# Patient Record
Sex: Male | Born: 1945 | Race: White | Hispanic: No | Marital: Married
Health system: Midwestern US, Academic
[De-identification: ages and names within clinical notes are randomized; demographics above are authoritative.]

---

## 2016-06-24 IMAGING — CR CHEST
2 series · 2 of 2 positions shown · non-contrast
Comparison: none

[chest pa x-wise]
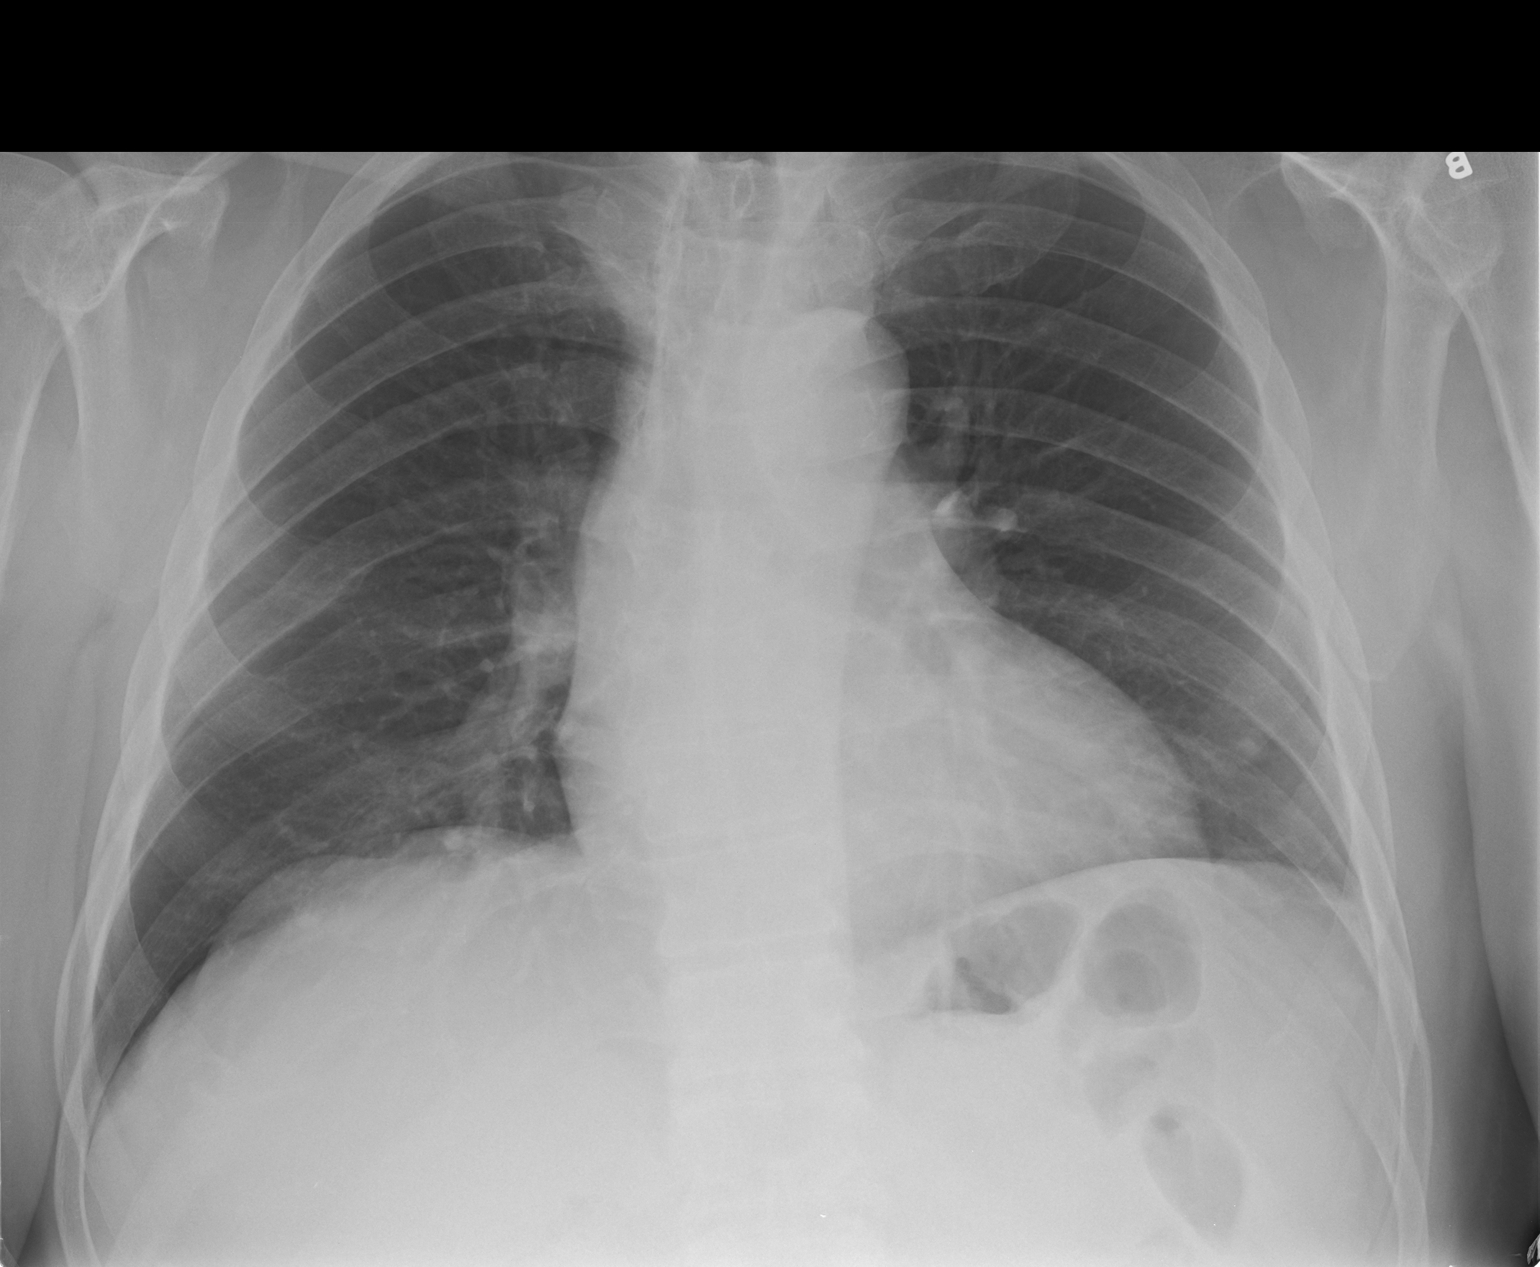

[chest lat]
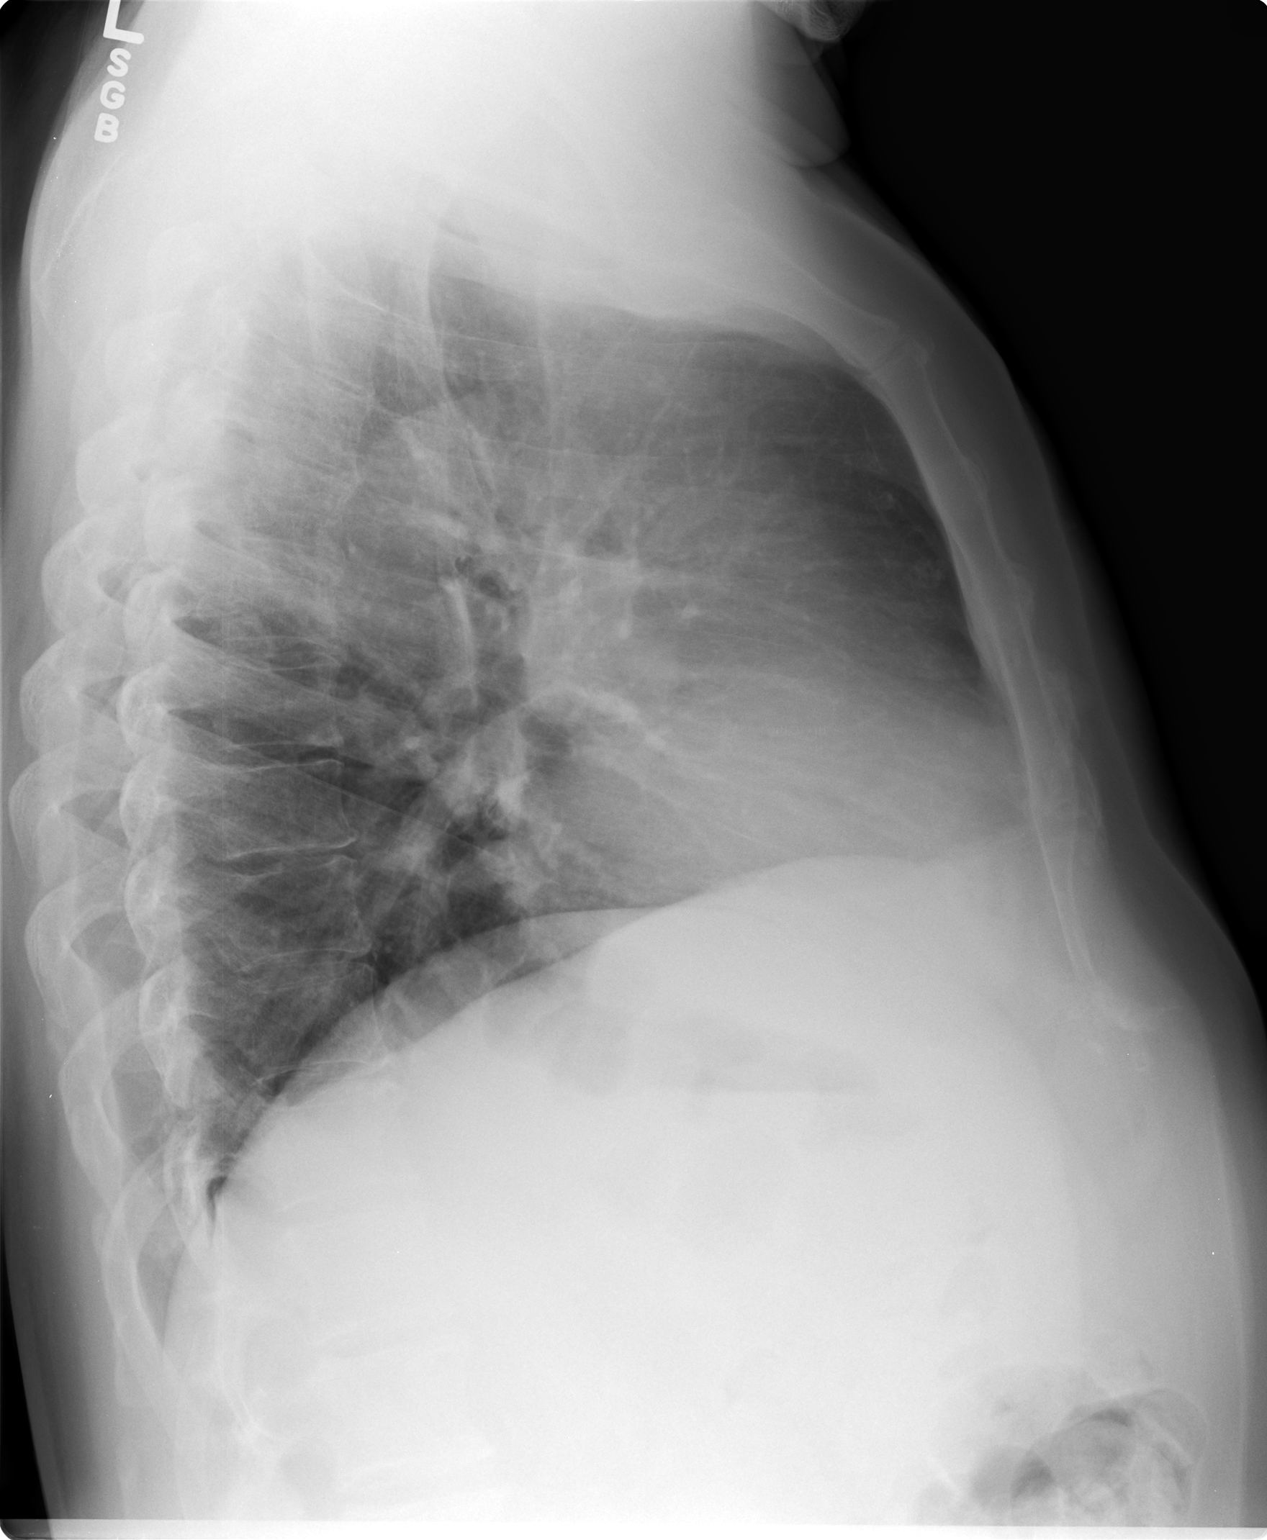

[2 of 2 positions shown; findings below may reference images not displayed]

DIAGNOSTIC STUDIES

EXAM

Chest x-ray two views.

INDICATION

BIBASILAR CRACKLES/BRONCHITIS
COUGH, SOA X 2 WEEKS. FEVER AT FIRST, BUT RESOLVED. PT STATES HTN,
CONTROLLED BY MEDS. AB

TECHNIQUE

PA and lateral views of the chest were obtained.

COMPARISONS

None available

FINDINGS

The cardiomediastinal silhouette is within normal limits. No acute infiltrates are seen. Small
granuloma is noted within the left lung base. Bony thorax is within normal limits.

IMPRESSION

No evidence for active disease in the chest.

## 2017-02-20 IMAGING — CR ABDOMEN
3 series · 3 of 3 positions shown · non-contrast
Comparison: none

[abd uprt x-wise]
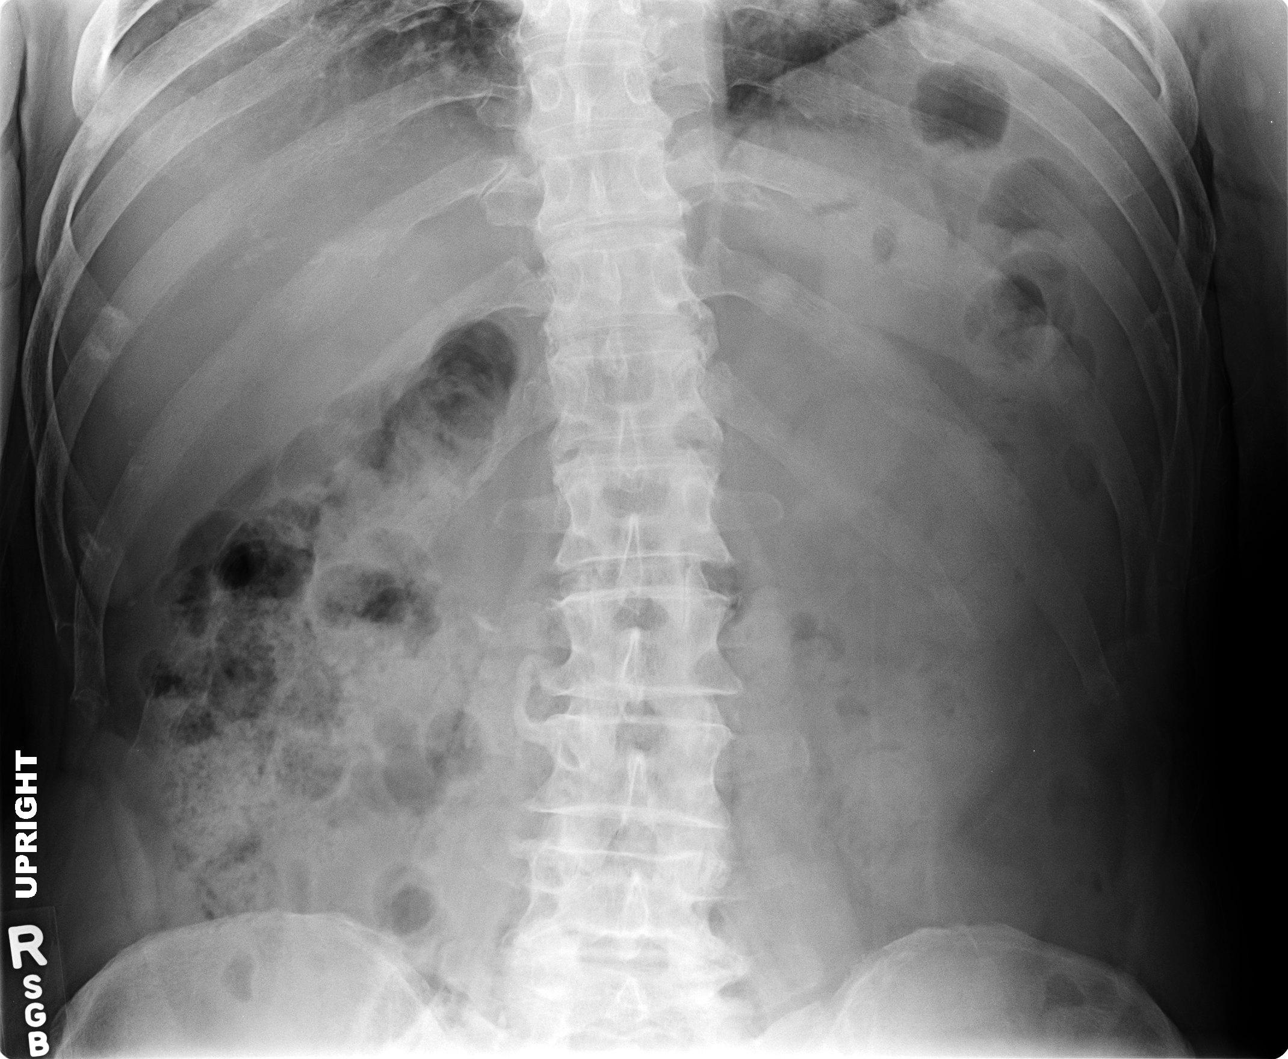

[abd supine x-wise (1 of 2)]
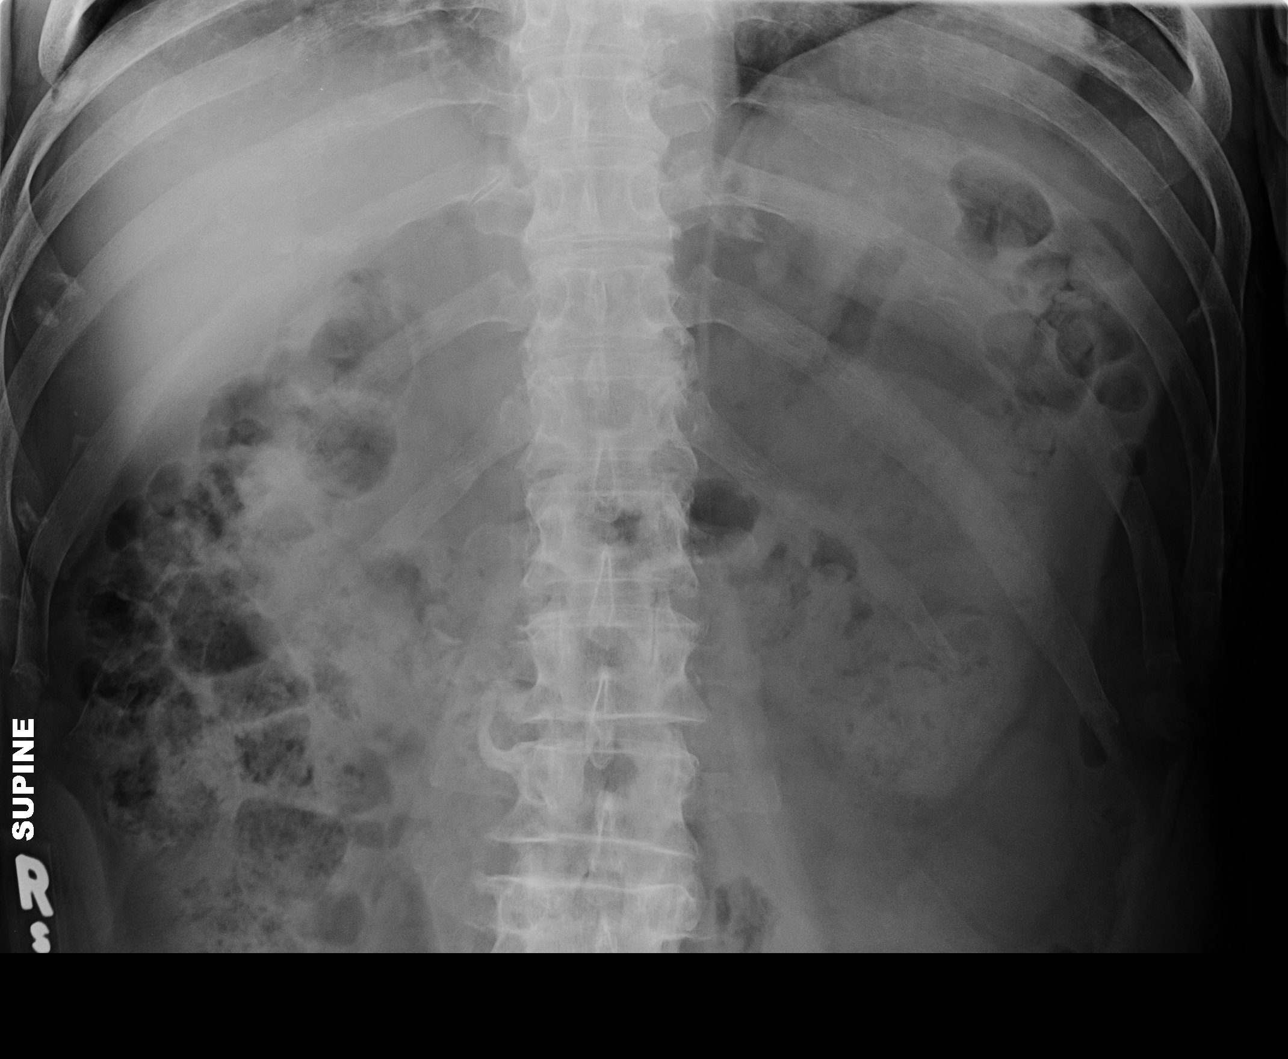

[abd supine x-wise (2 of 2)]
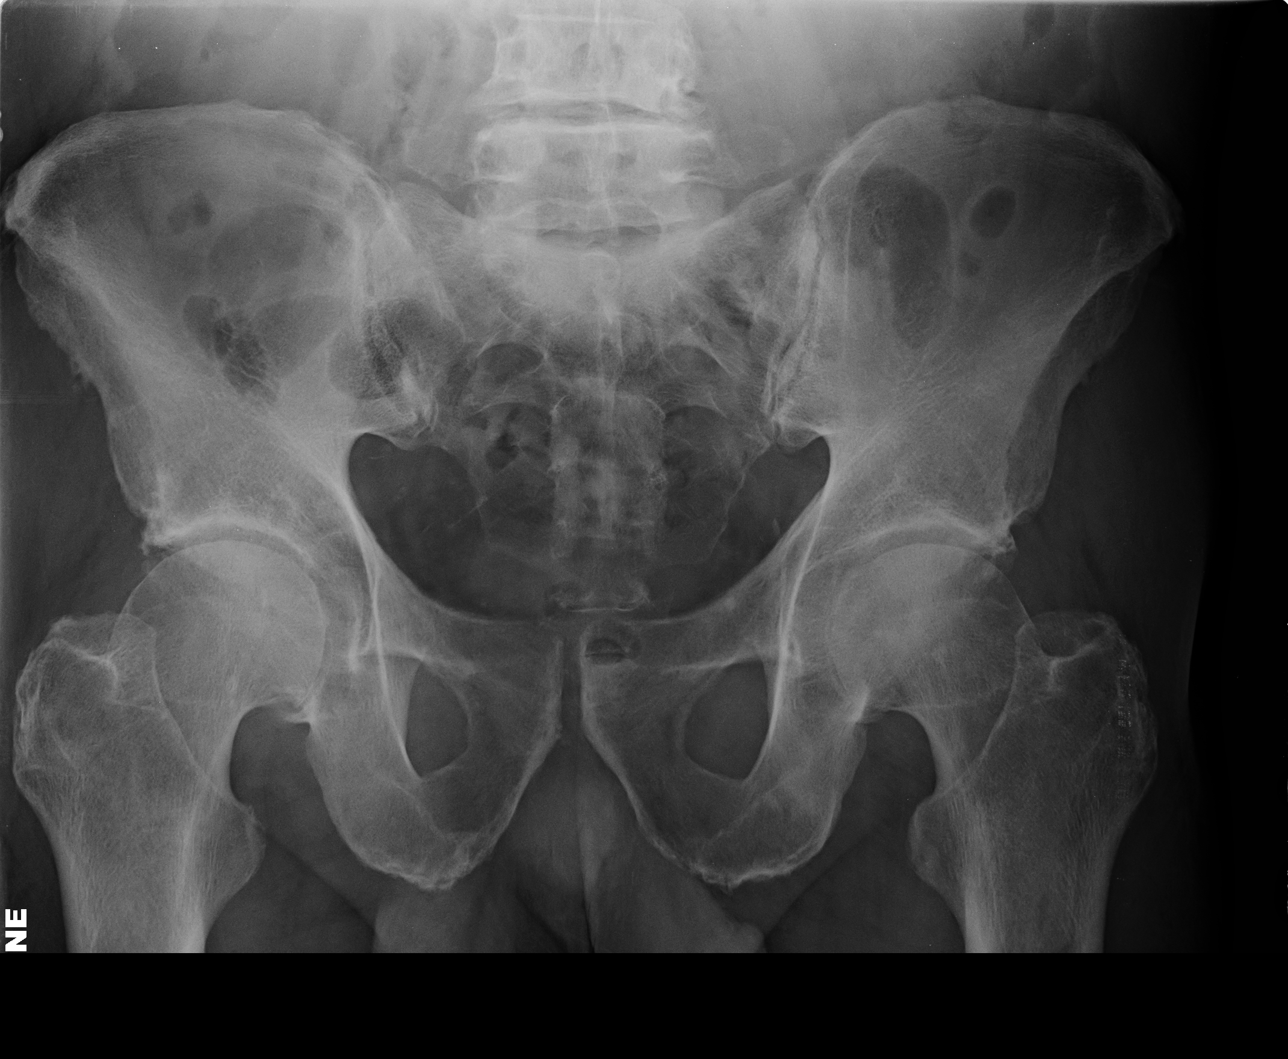

[3 of 3 positions shown; findings below may reference images not displayed]

DIAGNOSTIC STUDIES

EXAM
2 views abdomen

INDICATION
lower abdominal pain bilaterally.
PT STATE BILATERAL ABDOMINAL PAIN X 1WK. PAIN ONLY IN LOWER PART OF
ABDOMEN/GROIN AREA. NO RECENT SURGERIES. NO HX OF KIDNEY OR LIVER
COMPLICATIONS. SB

TECHNIQUE
Upright and supine views abdomen

COMPARISONS
No prior studies are available for comparison.

FINDINGS
There is a nonspecific, nonobstructive bowel gas pattern. There is a moderate] amount of
intracolonic fecal distention. There is no evidence of free air. There is no pathologic
calcification or acute osseous abnormality.

IMPRESSION
Nonspecific, nonobstructive bowel gas pattern. Moderate stool.

## 2017-06-13 IMAGING — US USSCROT
1 series · 14 of 25 positions shown · non-contrast
Comparison: none

[Series 1: us scrotum content · 14 of 53 slices shown]
[im 1/53]
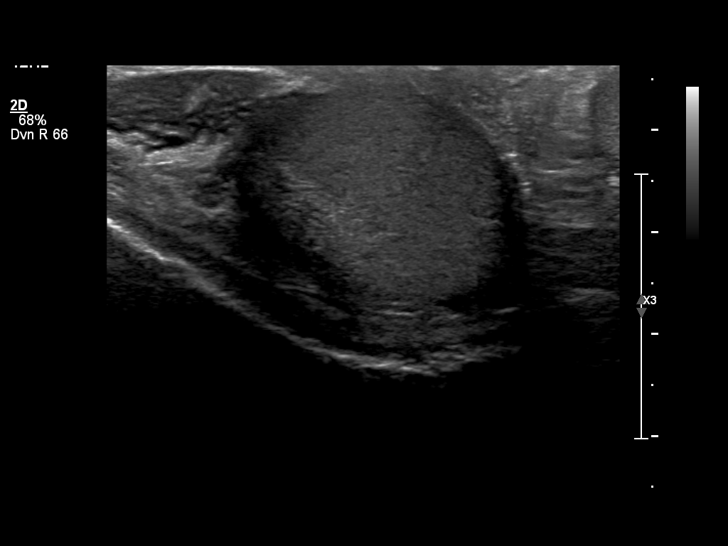
[im 5/53]
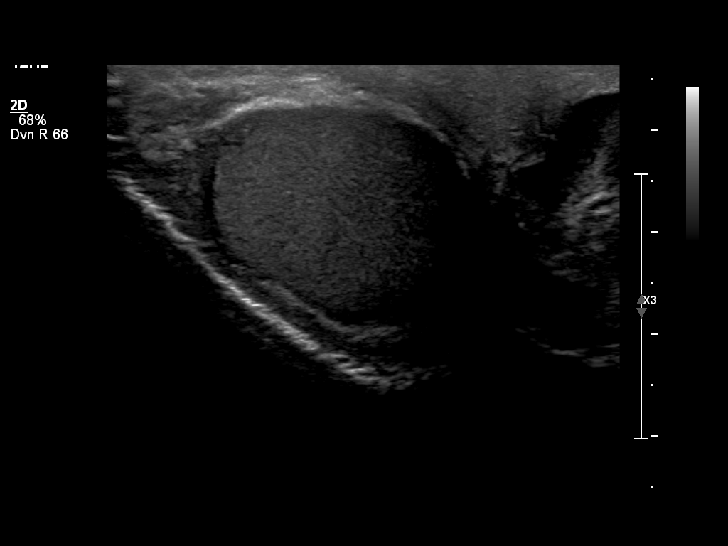
[im 9/53]
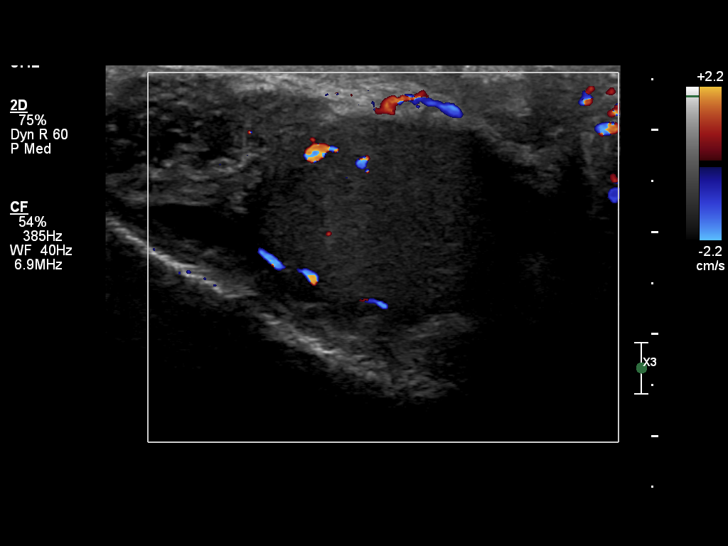
[im 14/53]
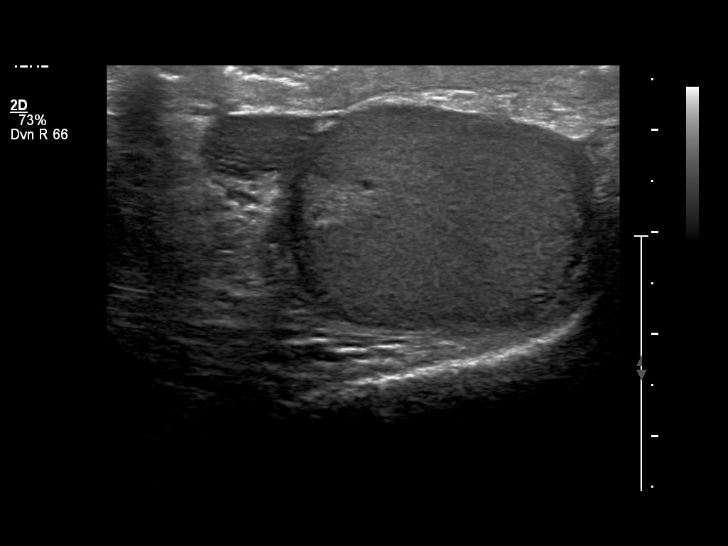
[im 18/53]
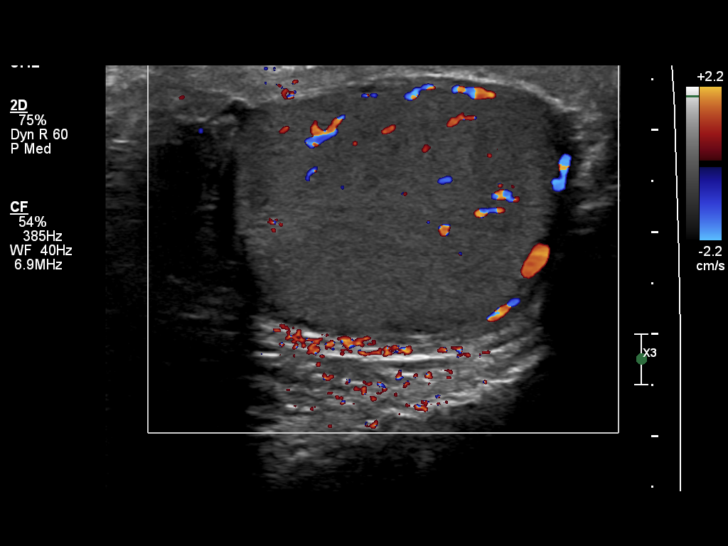
[im 20/53]
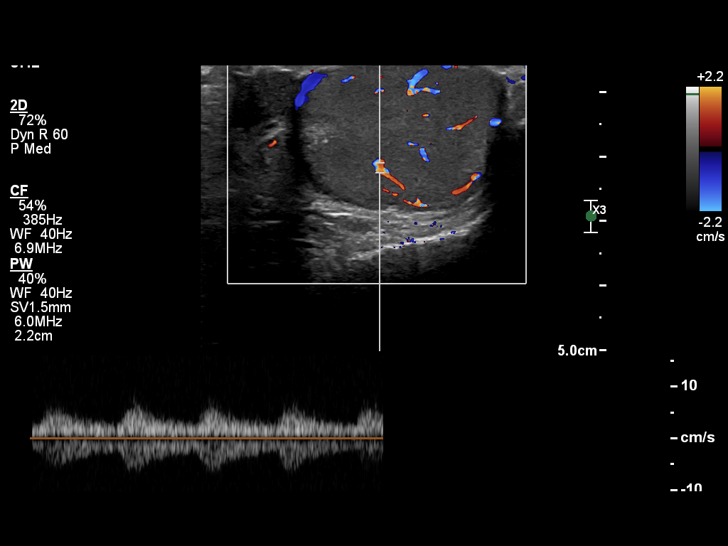
[im 24/53]
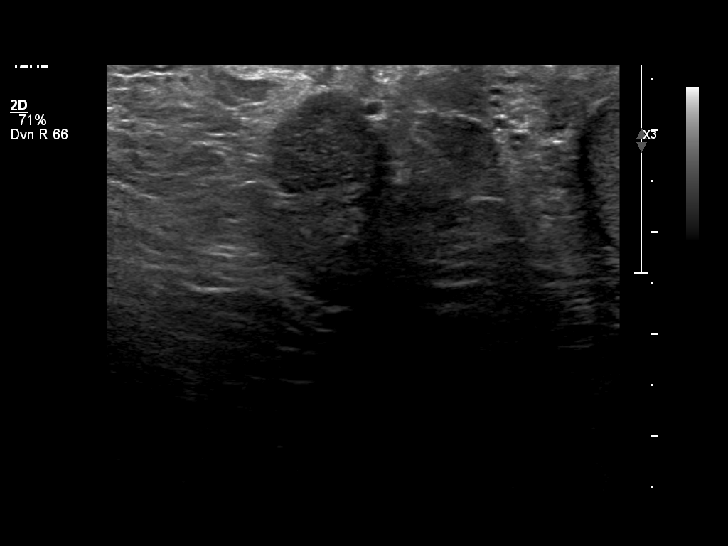
[im 29/53]
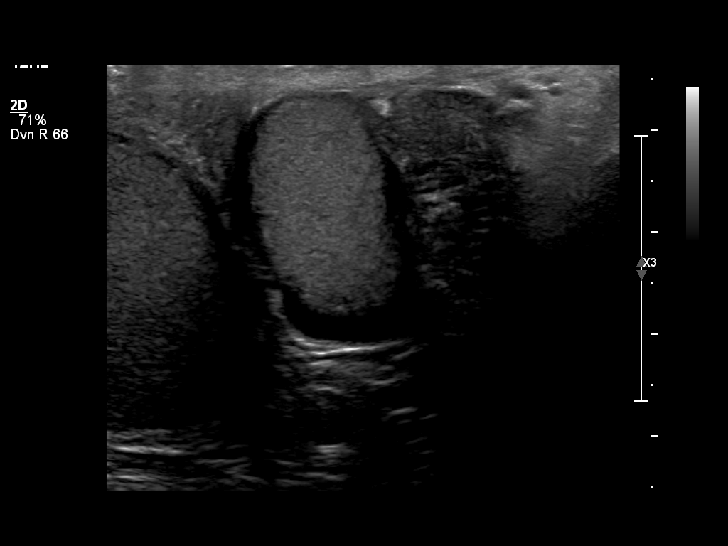
[im 33/53]
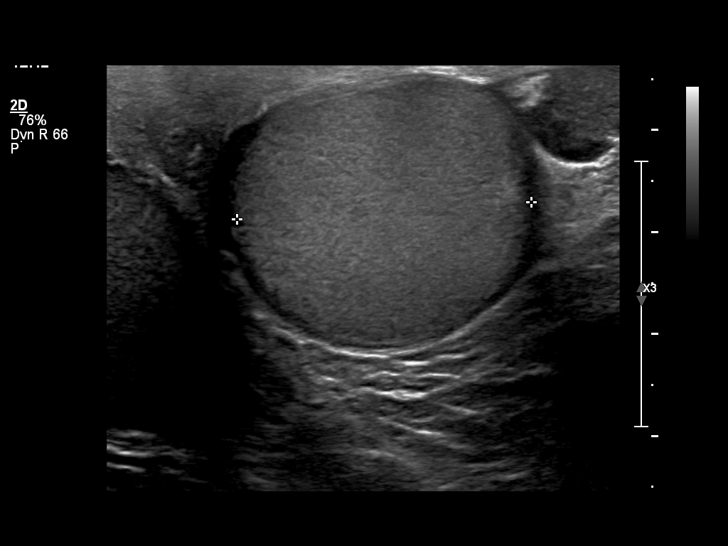
[im 35/53]
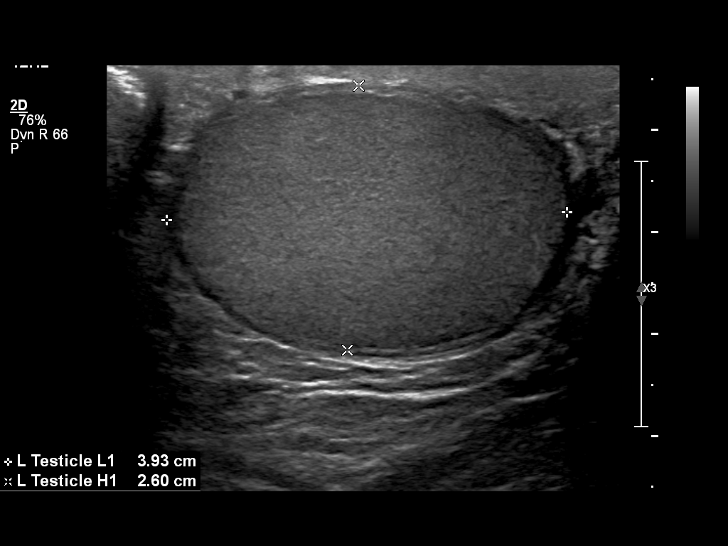
[im 40/53]
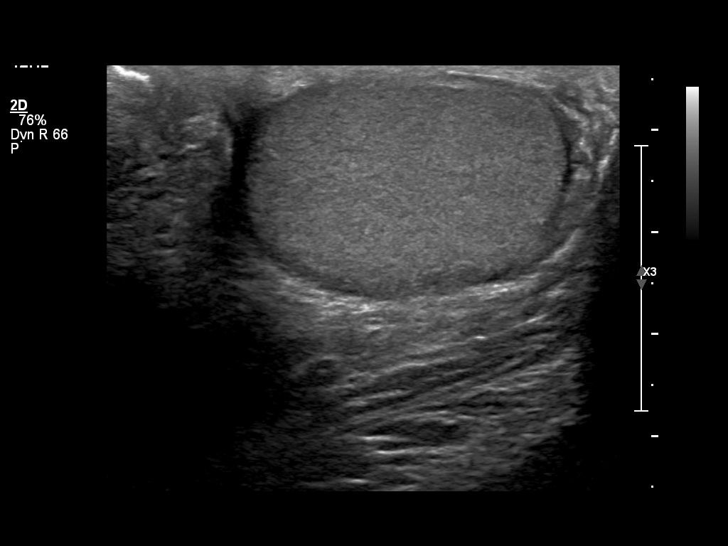
[im 44/53]
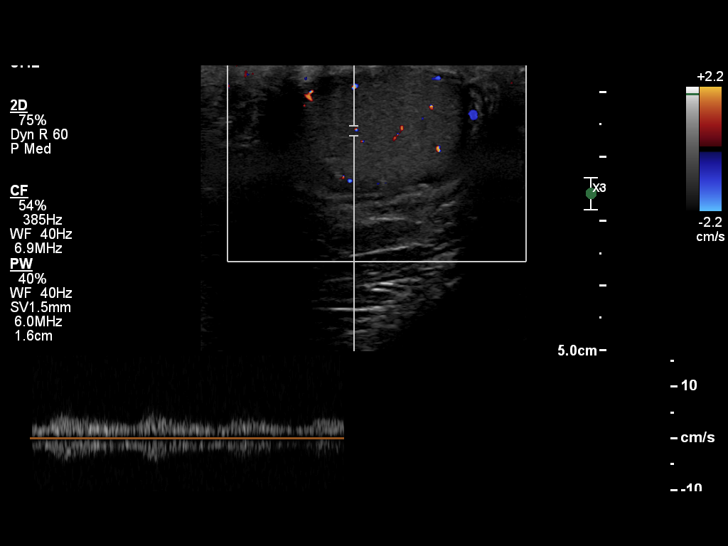
[im 48/53]
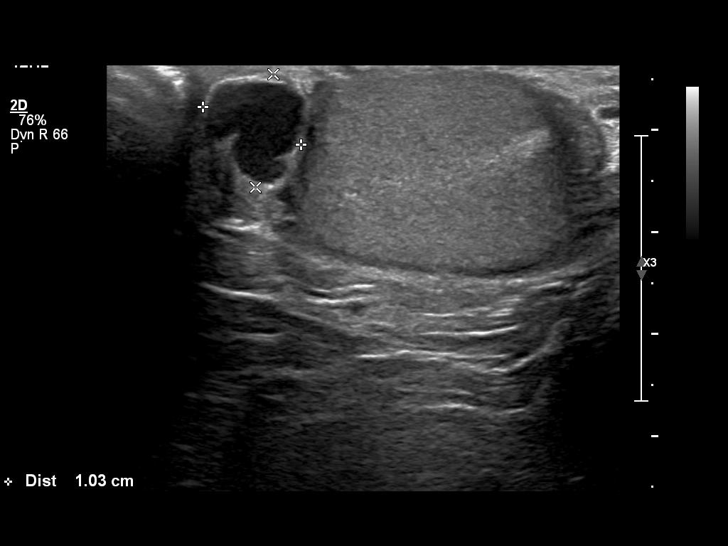
[im 53/53]
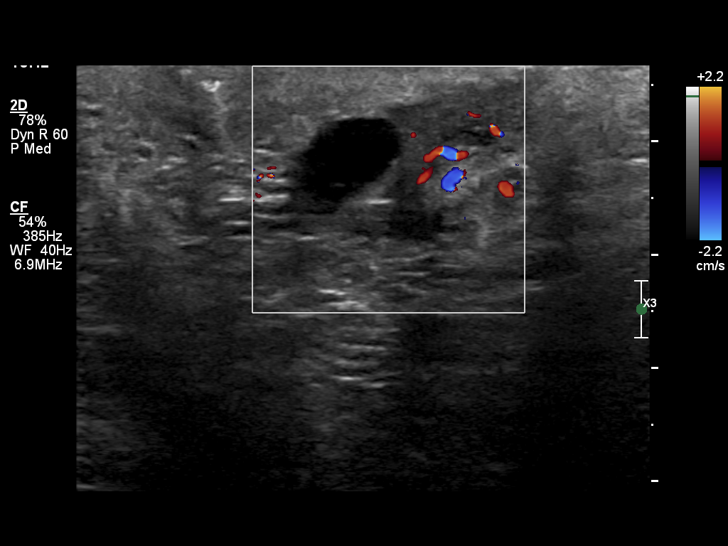

[14 of 25 positions shown; findings below may reference images not displayed]

ULTRASOUND REPORT

DIAGNOSTIC STUDIES

EXAM

ULTRASOUND, SCROTUM AND CONTENTS; CPT 25023, ULTRASOUND LOWER EXTREMITY RIGHT:

INDICATION

PAIN OF RIGHT INGUINAL RING; HX OF RIGHT INGUINAL HERNIA

TECHNIQUE

Multiple static grayscale and color Doppler ultrasound images were obtained.

COMPARISONS

No priors available for comparison.

FINDINGS

The testicles have a homogeneous echotexture and are normal in size. The right testicle measures
3.6 x 3.2 x 2.8 centimeters. Arterial flow within the right testicle. The right epididymis measures
1.2 centimeters. The left testicle measures 2.9 x 3.9 x 2.6 centimeters. Arterial flow within the
left testicle. The left epididymis measures 1.0 centimeters. There is a 1.1 centimeter cyst within
the head of the epididymis. No significant hydrocele. There is a reducible fat containing right
inguinal hernia.

IMPRESSION

1. Reducible fat containing right inguinal hernia. Follow up with a surgical consultation is
recommended.

## 2017-06-13 IMAGING — US [PERSON_NAME]
1 series · 14 of 16 positions shown · non-contrast
Comparison: none

[Series 1: us lower extrem right · 28 acquisitions, 14 frames shown]
[im 1/28]
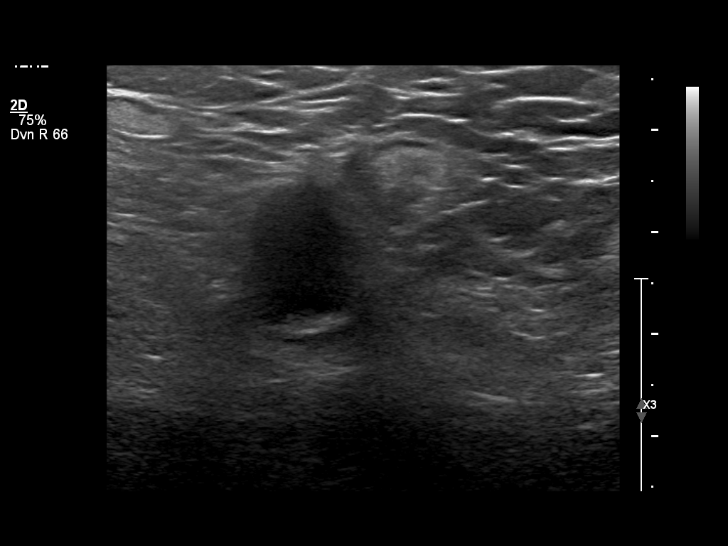
[im 2/28]
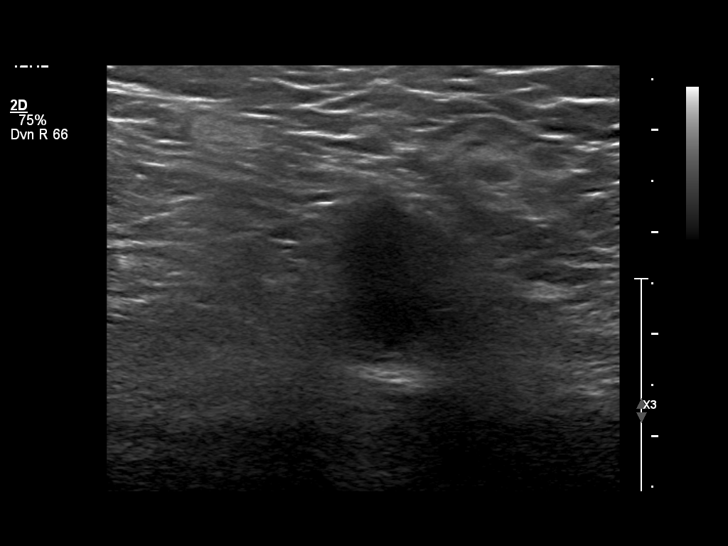
[im 4/28]
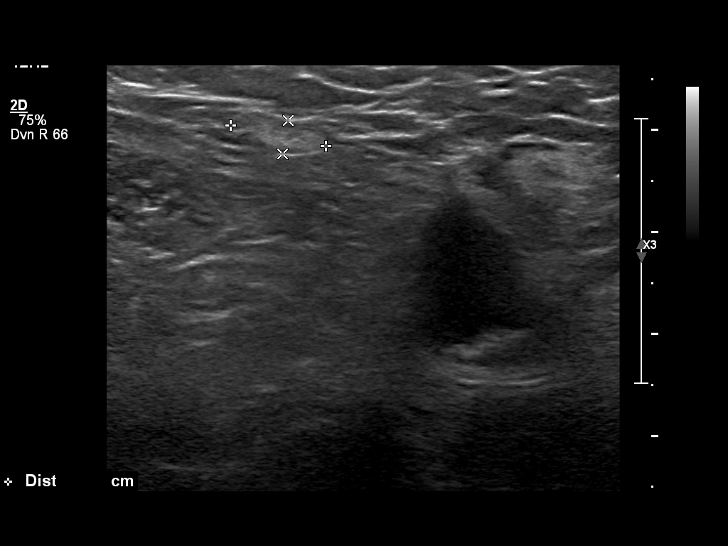
[im 8/28]
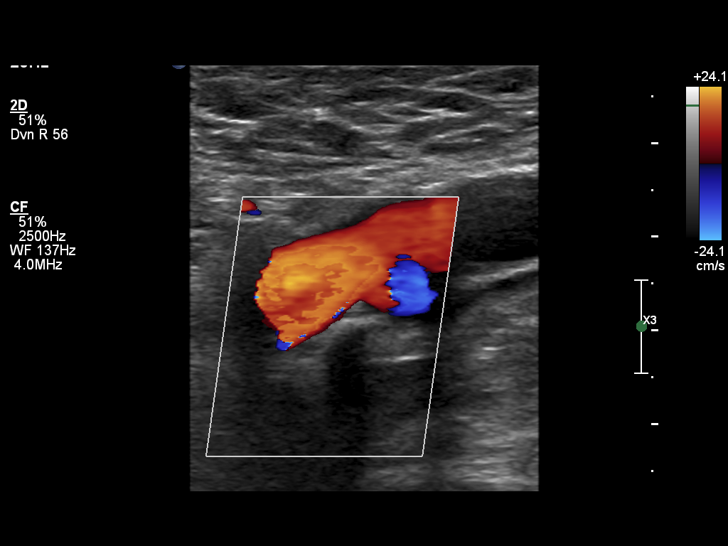
[im 10/28]
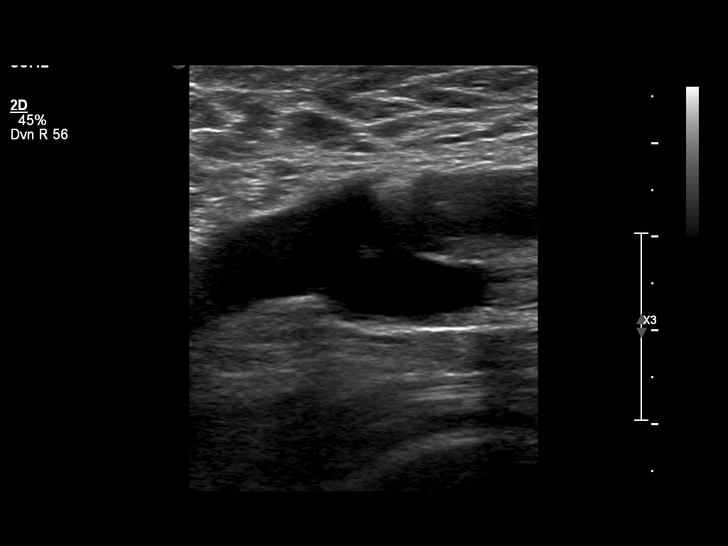
[im 11/28]
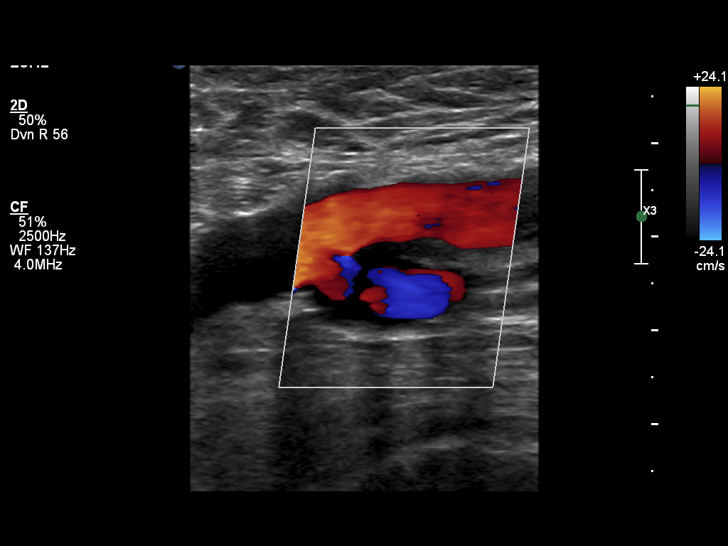
[im 13/28]
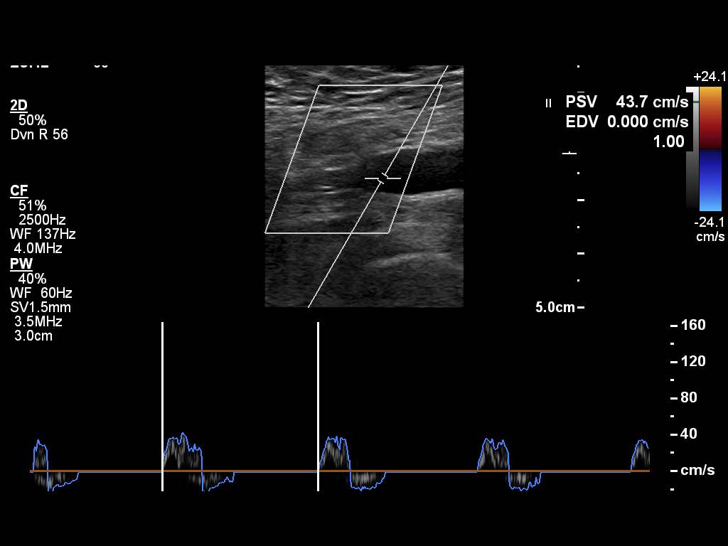
[im 15/28]
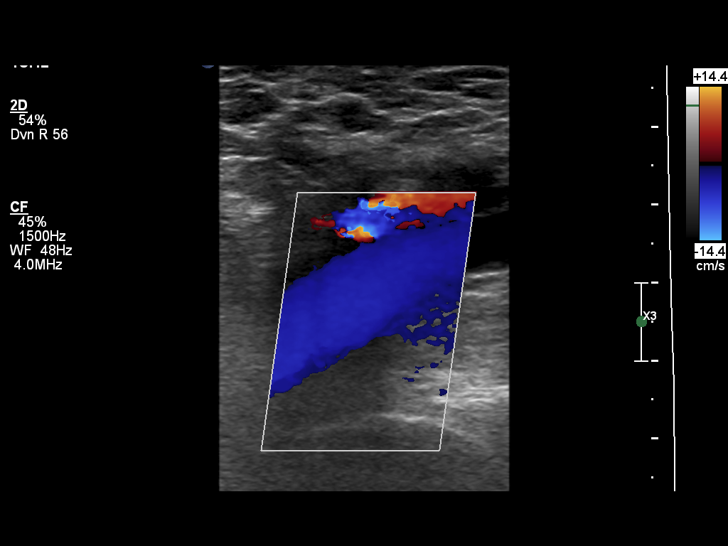
[im 17/28]
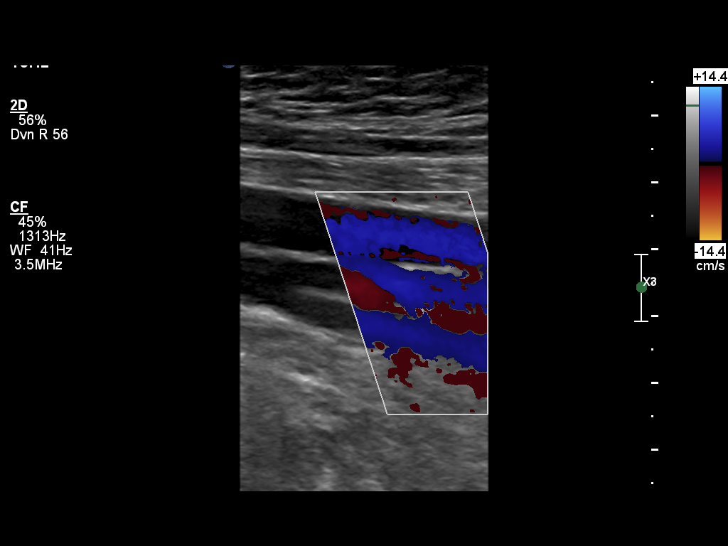
[im 19/28]
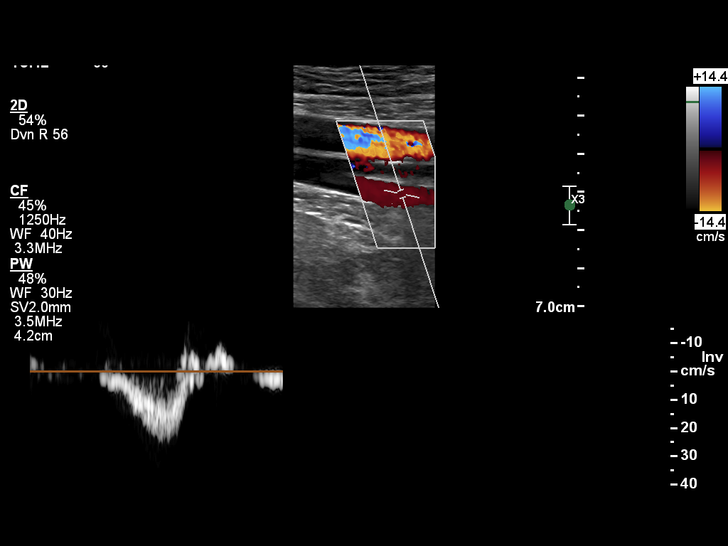
[im 22/28]
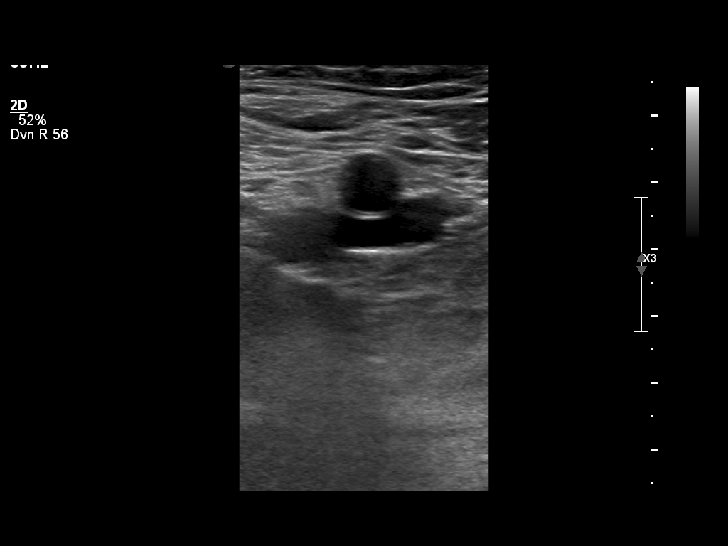
[im 24/28]
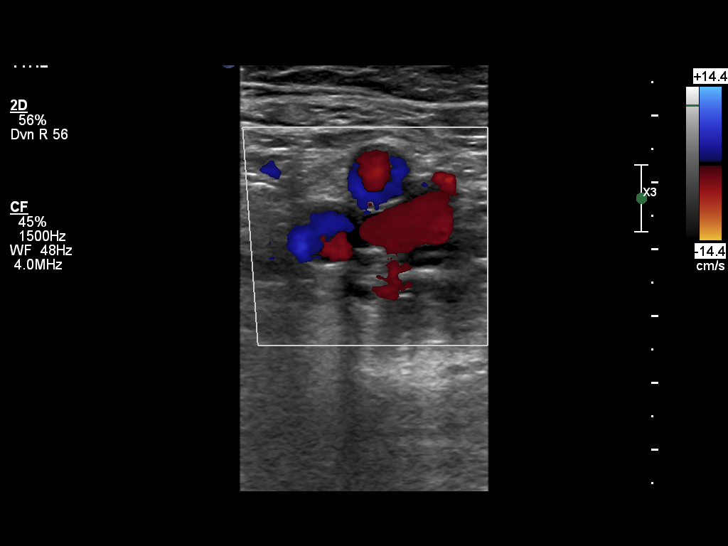
[im 26/28]
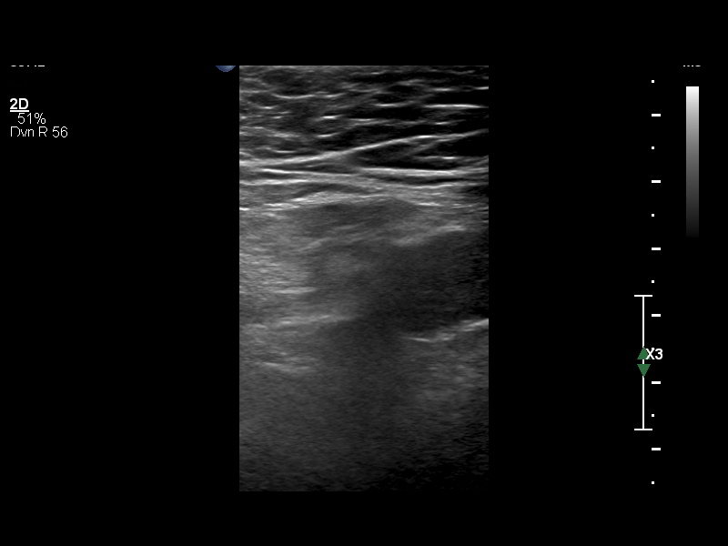
[im 28/28]
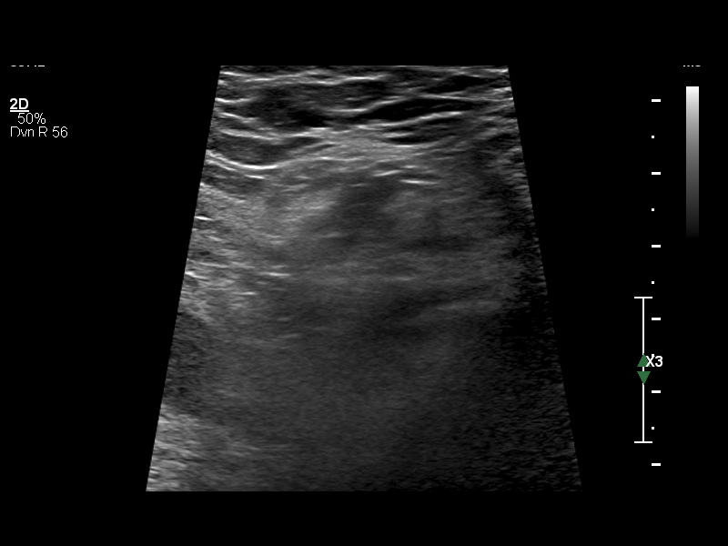

[14 of 16 positions shown; findings below may reference images not displayed]

ULTRASOUND REPORT

DIAGNOSTIC STUDIES

EXAM

ULTRASOUND, SCROTUM AND CONTENTS; CPT 25023, ULTRASOUND LOWER EXTREMITY RIGHT:

INDICATION

PAIN OF RIGHT INGUINAL RING; HX OF RIGHT INGUINAL HERNIA

TECHNIQUE

Multiple static grayscale and color Doppler ultrasound images were obtained.

COMPARISONS

No priors available for comparison.

FINDINGS

The testicles have a homogeneous echotexture and are normal in size. The right testicle measures
3.6 x 3.2 x 2.8 centimeters. Arterial flow within the right testicle. The right epididymis measures
1.2 centimeters. The left testicle measures 2.9 x 3.9 x 2.6 centimeters. Arterial flow within the
left testicle. The left epididymis measures 1.0 centimeters. There is a 1.1 centimeter cyst within
the head of the epididymis. No significant hydrocele. There is a reducible fat containing right
inguinal hernia.

IMPRESSION

1. Reducible fat containing right inguinal hernia. Follow up with a surgical consultation is
recommended.

## 2017-09-23 IMAGING — CR CHEST
2 series · 2 of 2 positions shown · non-contrast
Comparison: none

[chest pa x-wise]
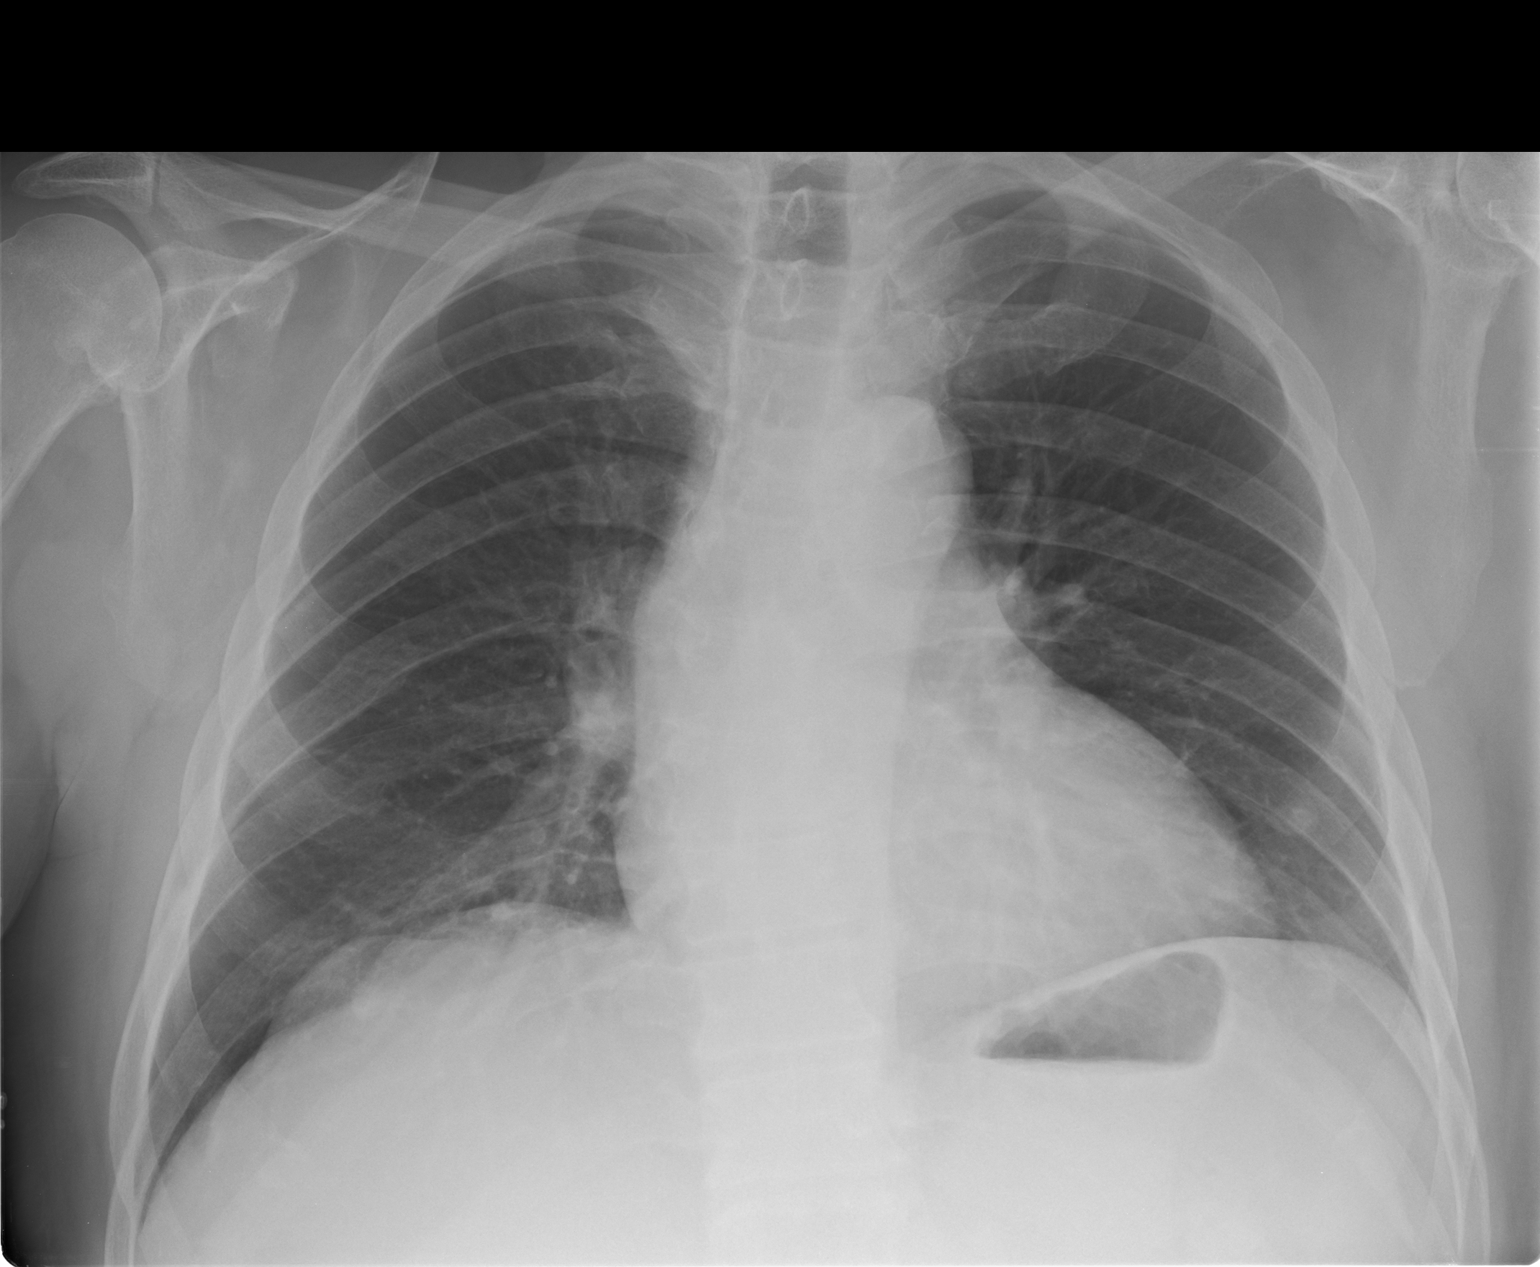

[chest lat]
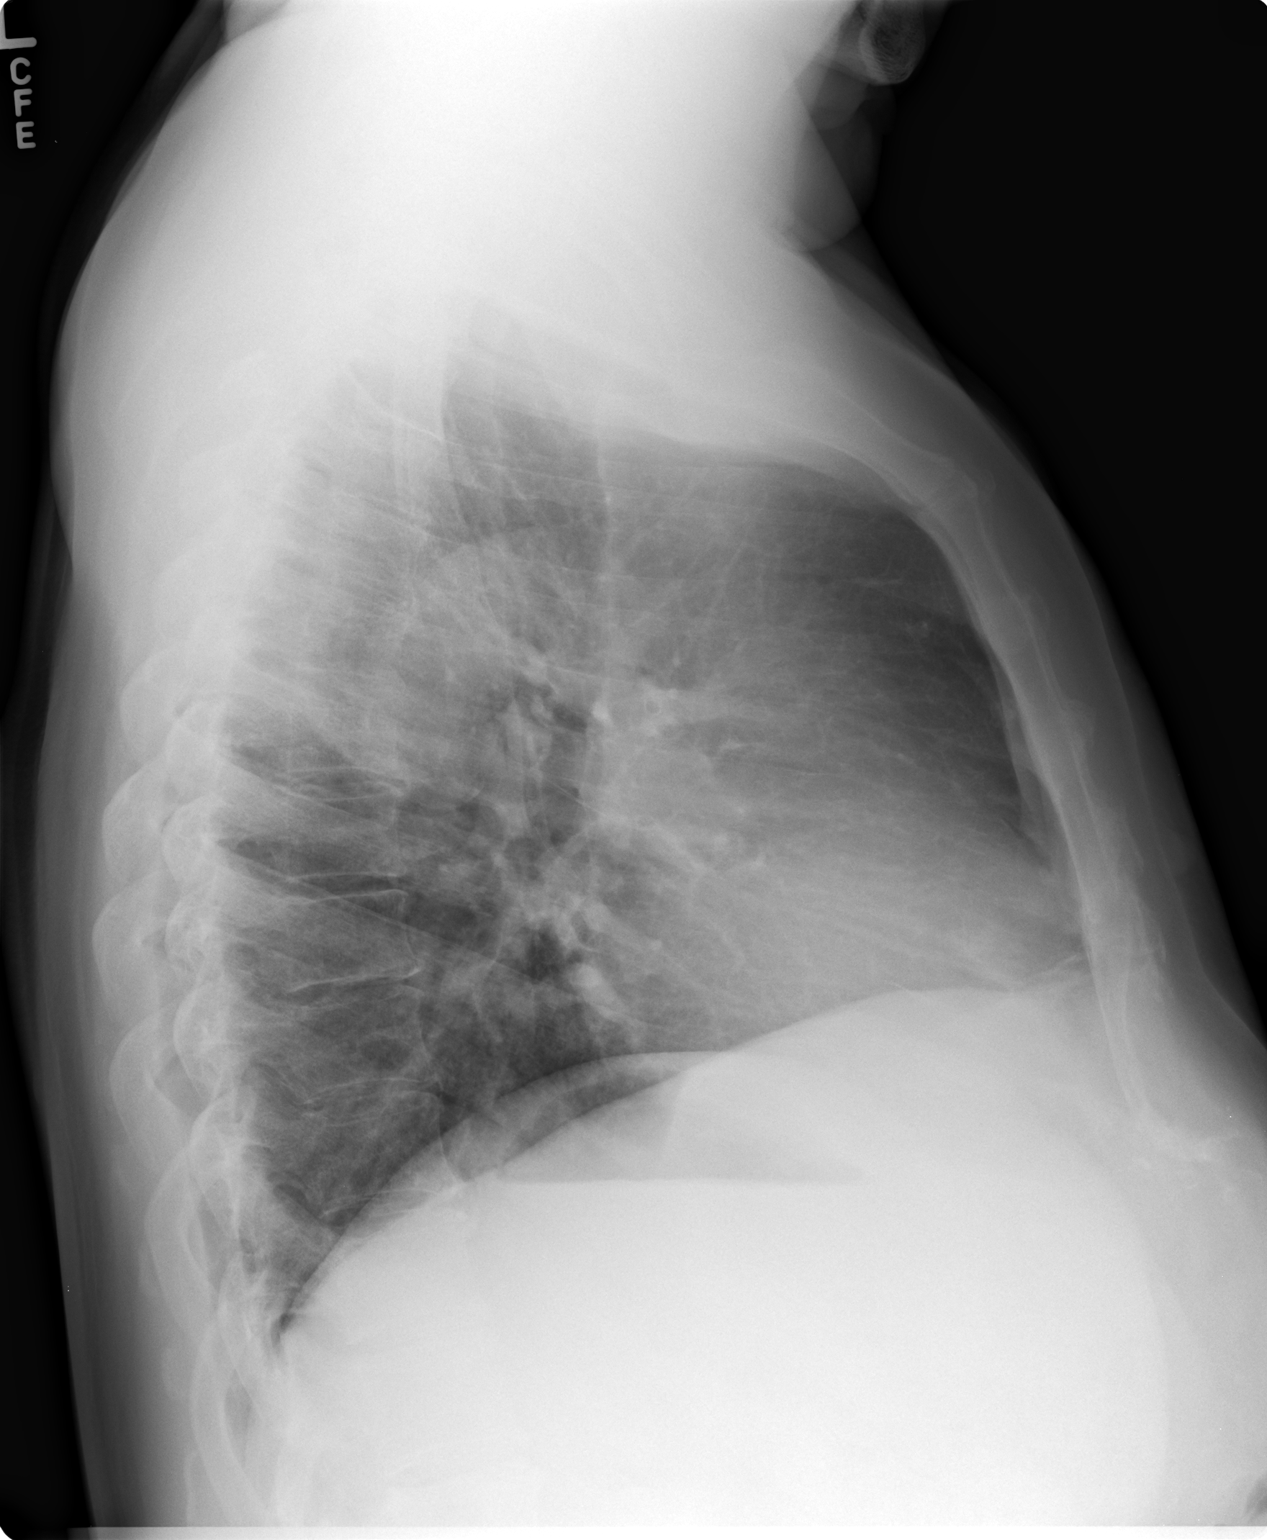

[2 of 2 positions shown; findings below may reference images not displayed]

DIAGNOSTIC STUDIES

EXAM

RADIOLOGICAL EXAMINATION, CHEST; 2 VIEWS FRONTAL AND LATERAL CPT 35414

INDICATION

Atypical Chest Pain
PT STATES FOLLOW-UP INFLUENZA. CE/ME

TECHNIQUE

Frontal and lateral views of the chest were performed.

COMPARISONS

06/24/2016

FINDINGS

The cardiac and mediastinal contours are unremarkable. There is no evidence of acute consolidation,
congestion, or pleural effusions. No acute displaced fracture.

IMPRESSION

Mild peribronchial thickening. No dense consolidation.

Tech Notes:

PT STATES FOLLOW-UP INFLUENZA. CE/ME

## 2018-02-28 IMAGING — CR CHEST
2 series · 2 of 2 positions shown · non-contrast
Comparison: none

[chest pa x-wise]
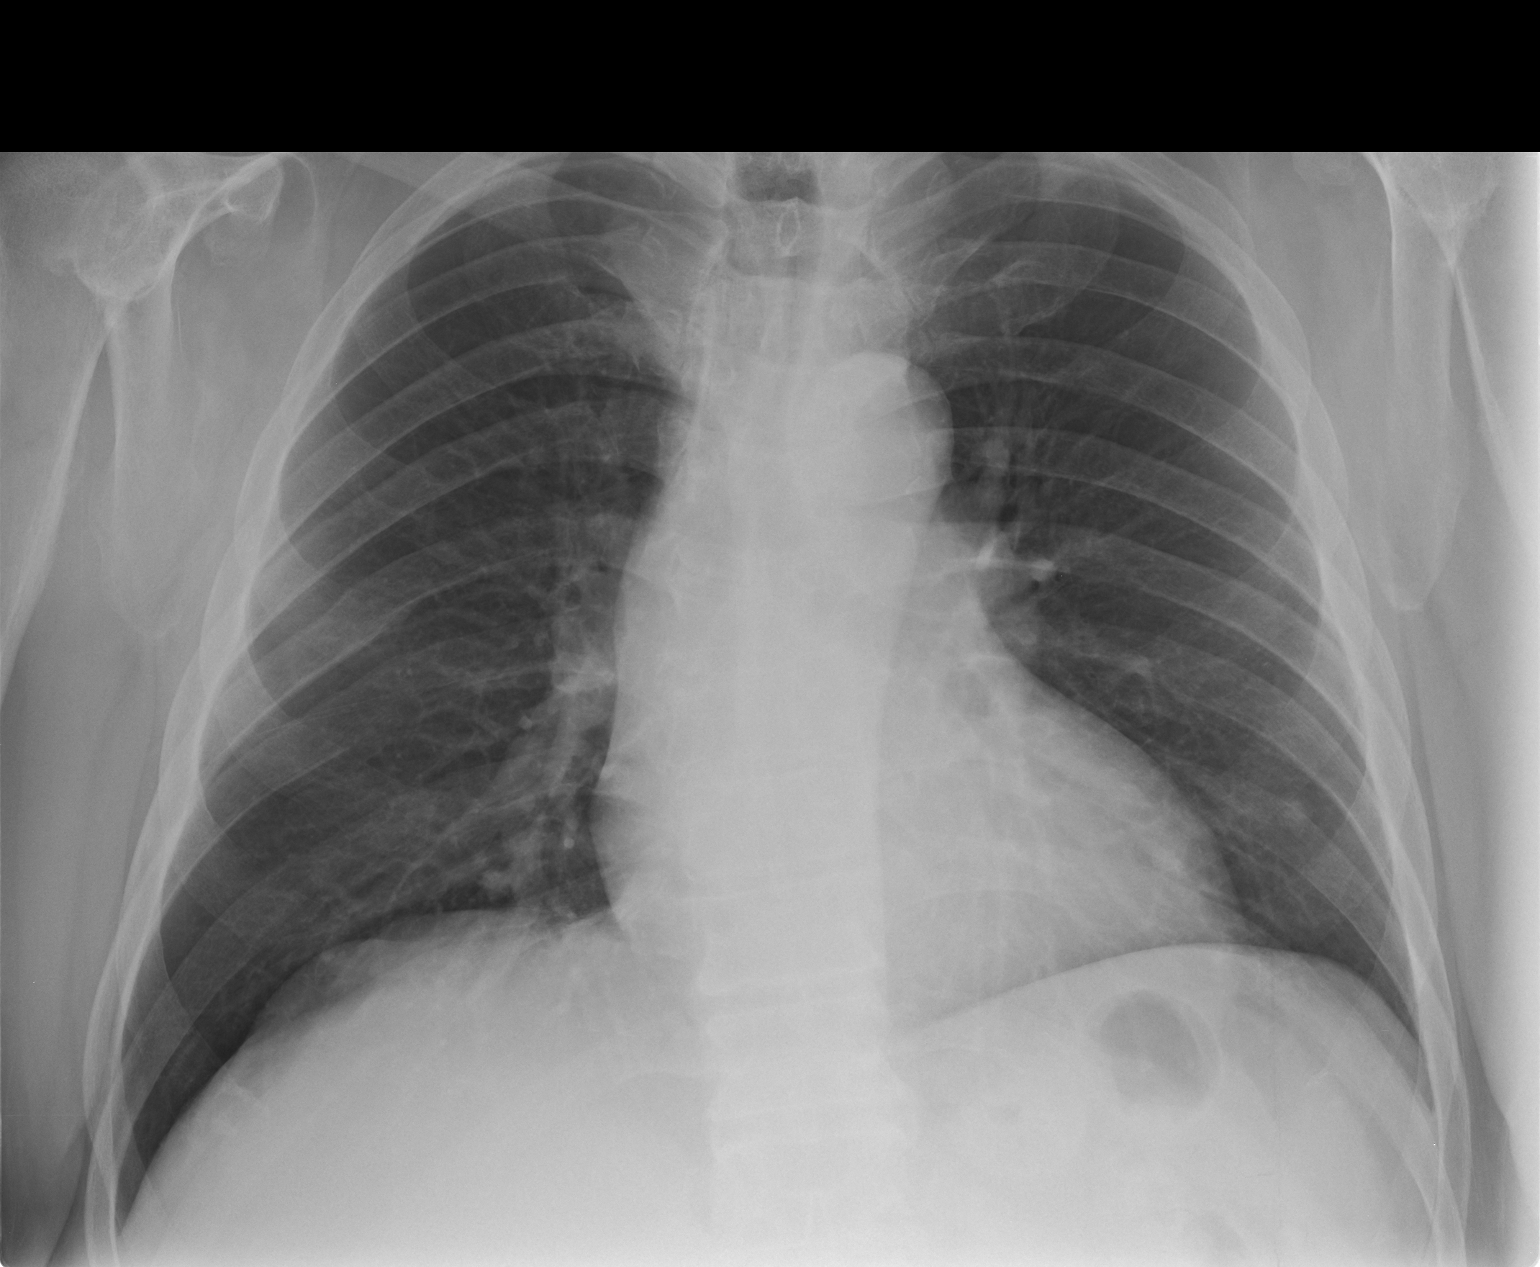

[chest lat]
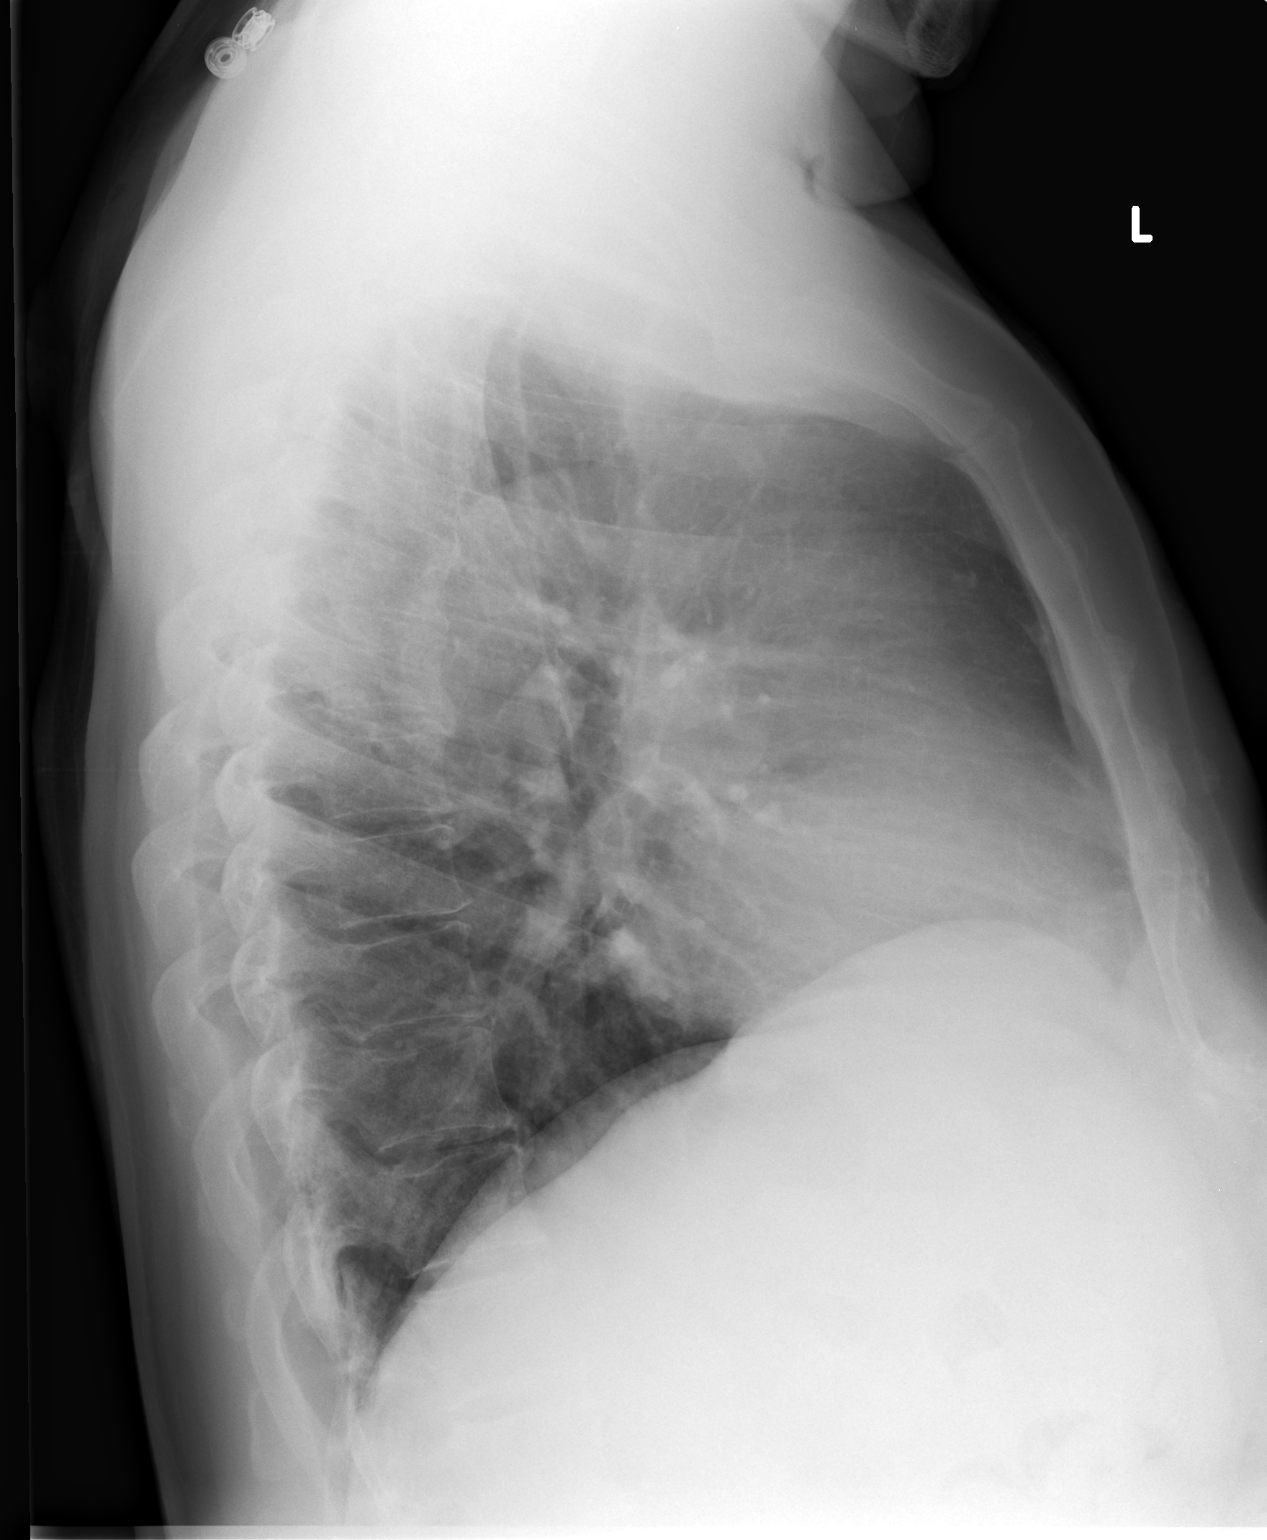

[2 of 2 positions shown; findings below may reference images not displayed]

EXAM
Chest x-ray.

INDICATION
cough congetesto
pt. c/o cough and congestion. hx of stent placement x11 years ago.

FINDINGS
Two views of the chest were obtained. The previous exam was reviewed from 09/23/2017.
The lungs are mildly hyperinflated and clear. There is a calcified granuloma in the left lung base.

The heart size and pulmonary vascularity are normal.
There are no bone lesions.

IMPRESSION
There is no significant radiographic abnormality of the chest.

Tech Notes:

pt. c/o cough and congestion. hx of stent placement x11 years ago.

## 2019-09-17 IMAGING — CR PELVIS
3 series · 3 of 3 positions shown · non-contrast
Comparison: none

[pelvis]
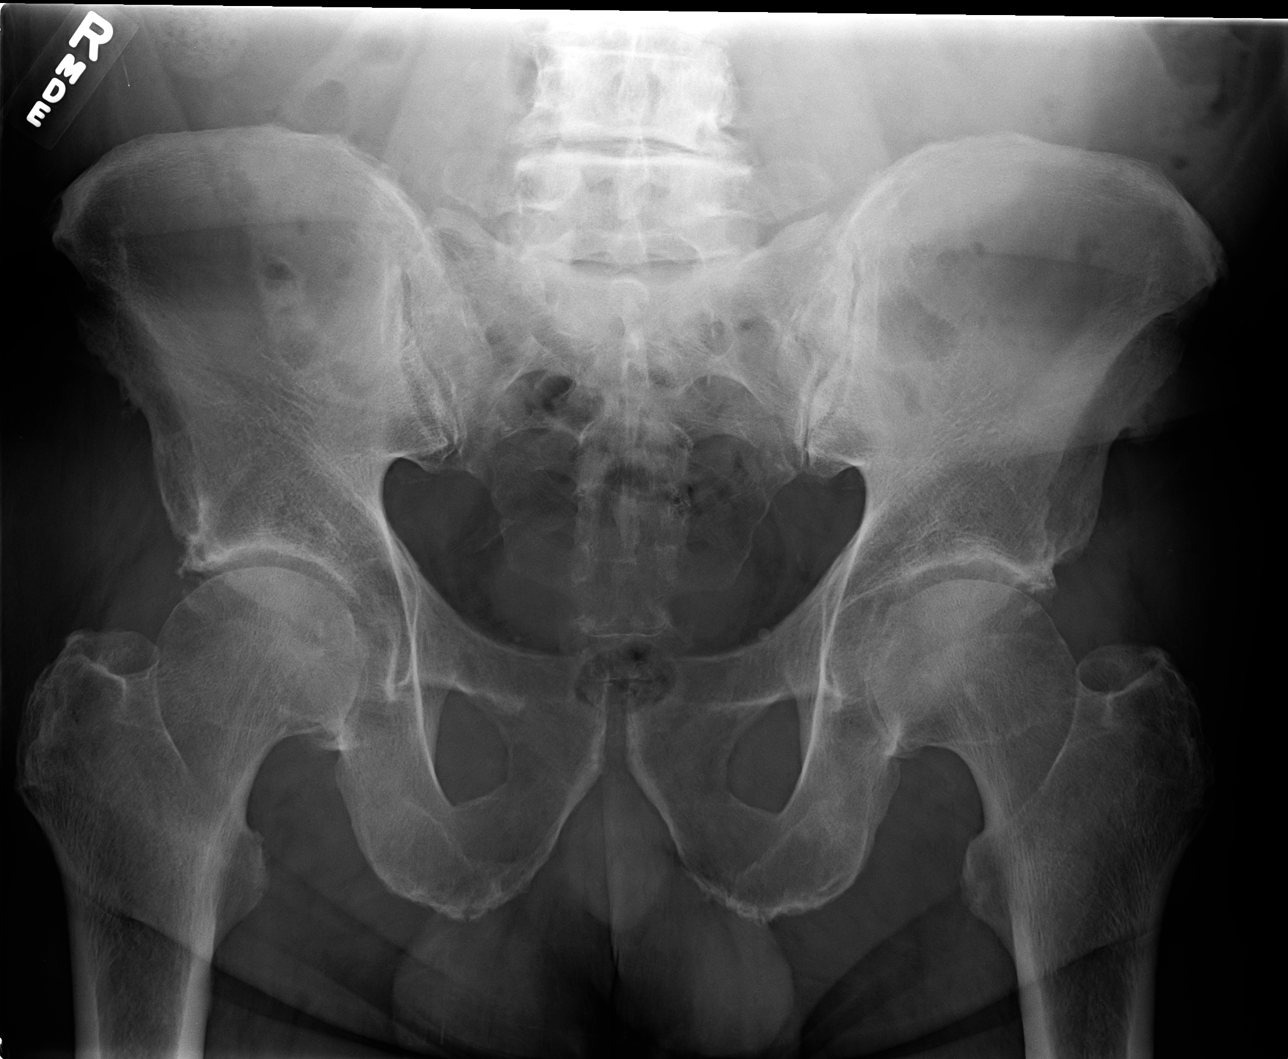

[hip ap]
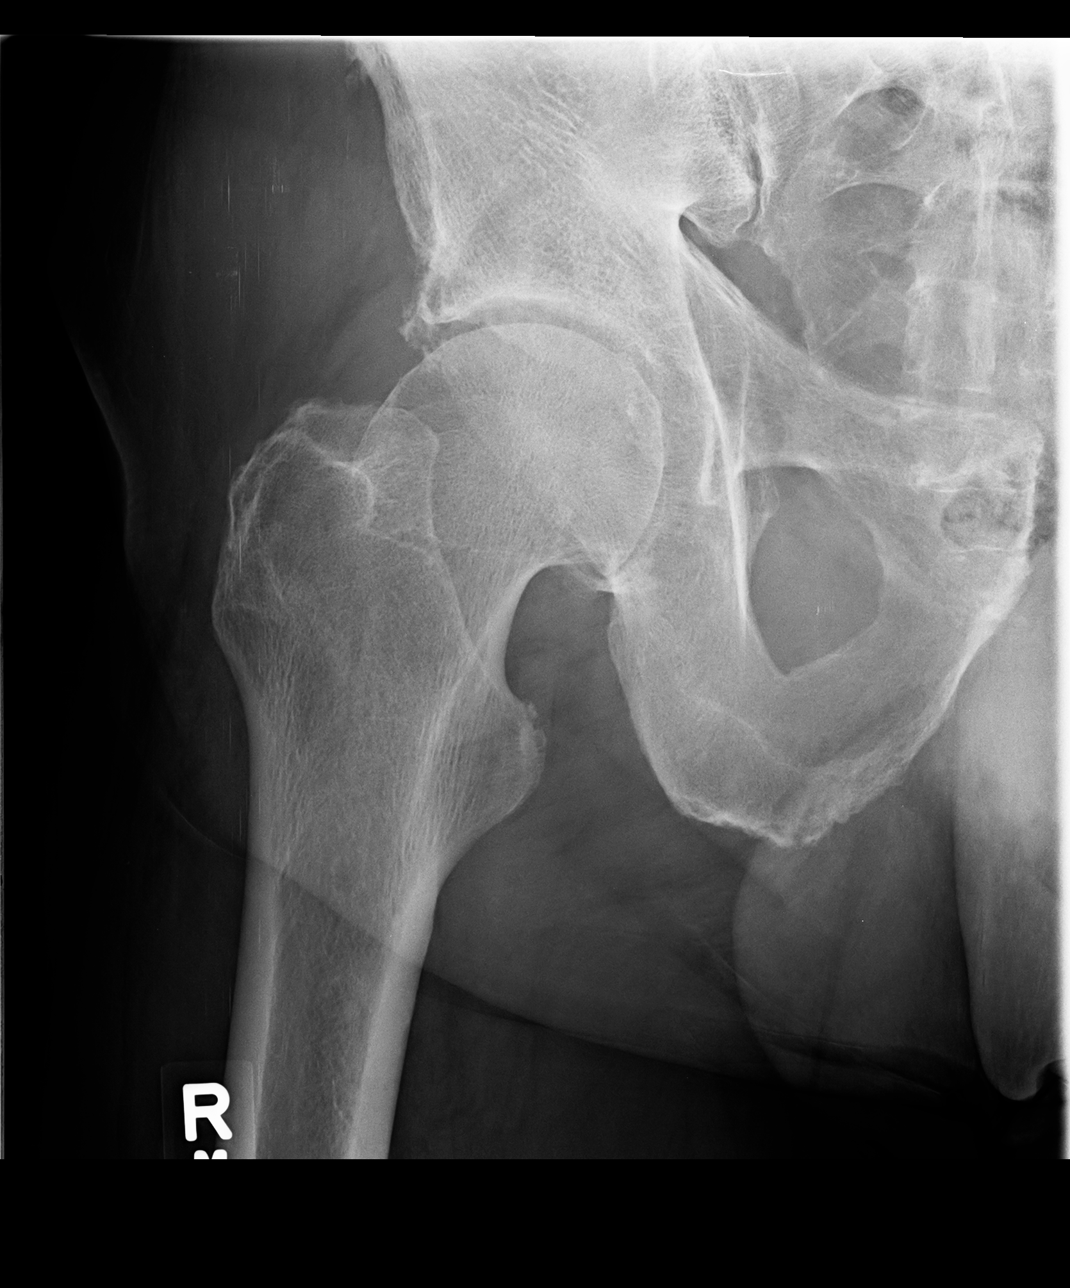

[hip frog]
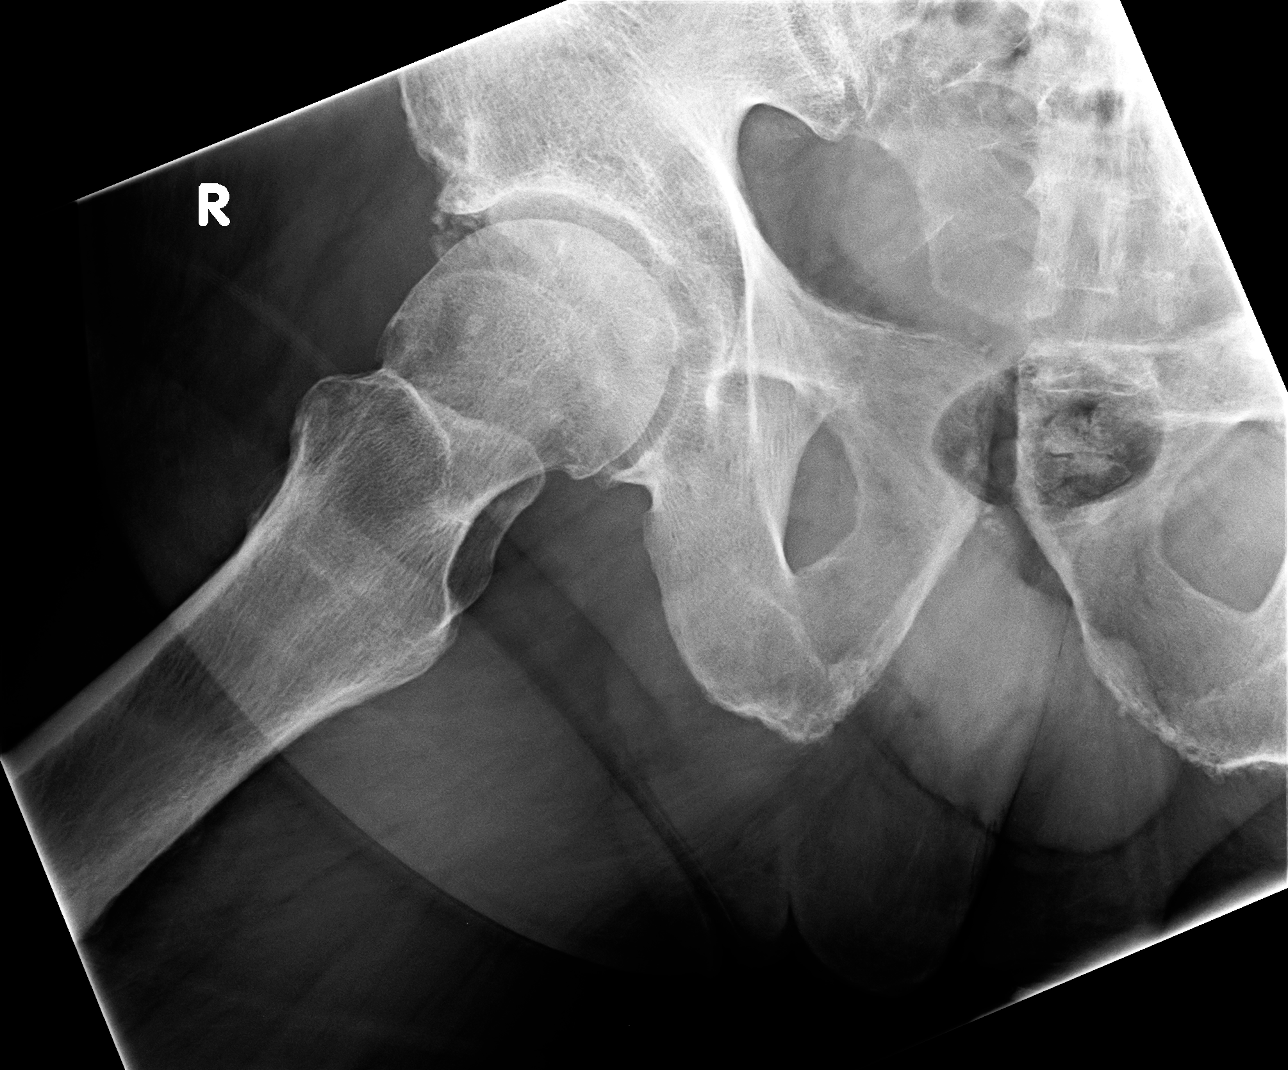

[3 of 3 positions shown; findings below may reference images not displayed]

EXAM

XR hip RT, 2-3V w or wo pelvis

INDICATION

hip pain
Pt states he stumbled in June 2019 and continues to have right hip pain. ME

TECHNIQUE

AP view of the pelvis with AP and frogleg lateral views of the right hip

COMPARISONS

None available at the time of dictation.

FINDINGS

No radiographic evidence of an acute fracture, osseous malalignment, or aggressive focal osseous
lesion. There is anatomic joint alignment. Marginal osteophytosis in spurring along the acetabular
rims compatible with degenerative change of bilateral hips. Symmetric sacroiliac joints.
Unremarkable pubic symphyseal joint. Diffusely decreased bone mineralization.

IMPRESSION
1. No radiographic evidence of an acute osseous abnormality.
2. Degenerative change of bilateral hips.

Tech Notes:

Pt states he stumbled in June 2019 and continues to have right hip pain. ME

## 2019-09-21 IMAGING — MR Hips^ROUTINE
4 of 5 series · 30 of 48 positions shown · non-contrast
Comparison: none

[Series 3: T2 fat-sat · axial · 5.0mm · 0.68mm/px · z∈[-48,+121]mm · 8 of 27 slices shown (1 of 2)]
[im 1/27]
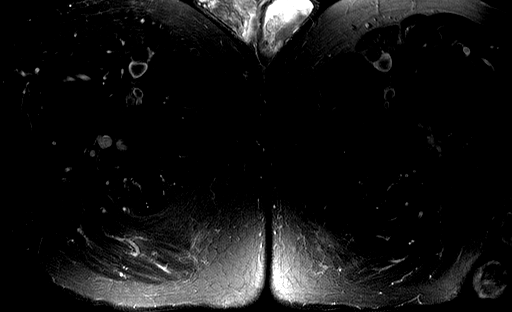
[im 3/27]
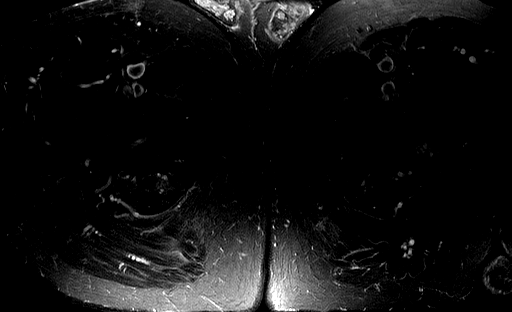
[im 9/27]
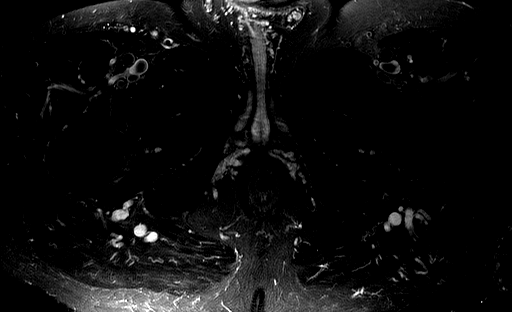
[im 12/27]
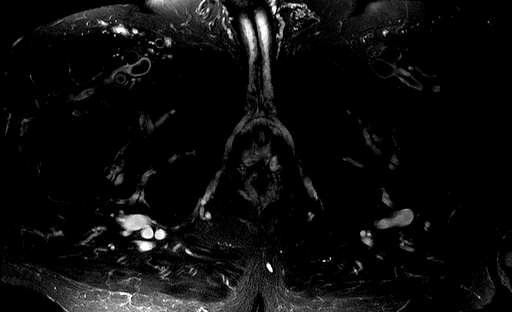
[im 15/27]
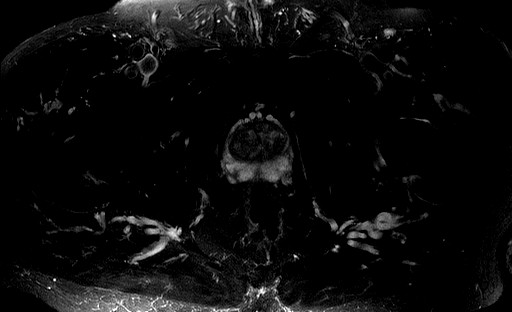
[im 18/27]
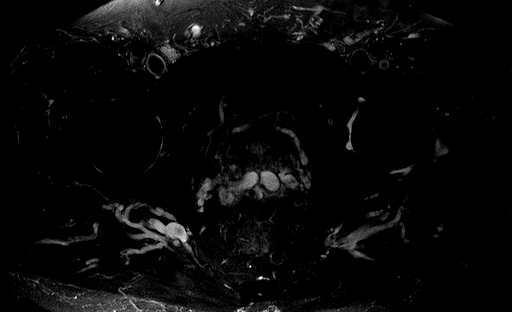
[im 24/27]
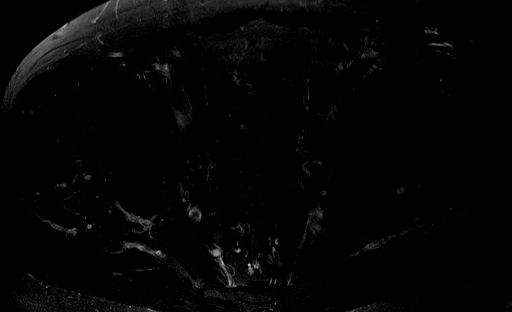
[im 27/27]
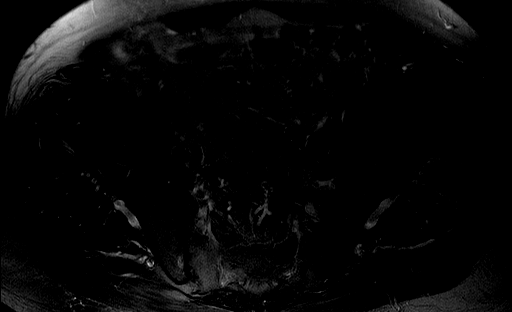

[Series 4: T1 · coronal · 4.0mm · 0.74mm/px · 8 of 20 slices shown]
[im 1/20]
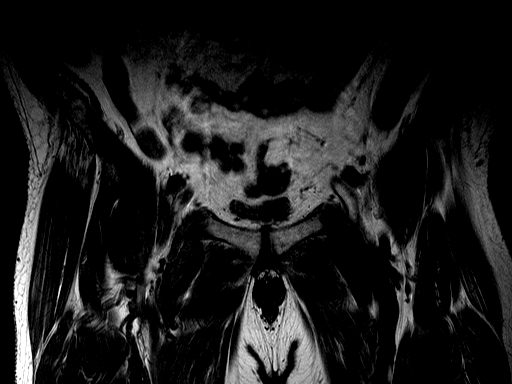
[im 3/20]
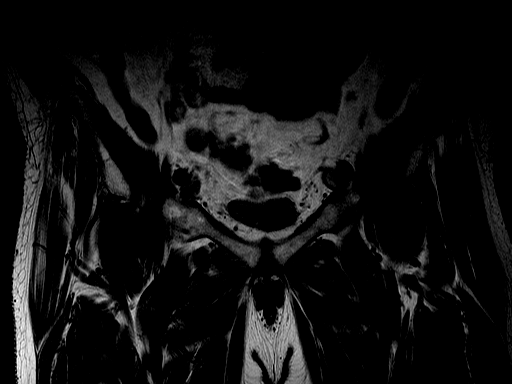
[im 6/20]
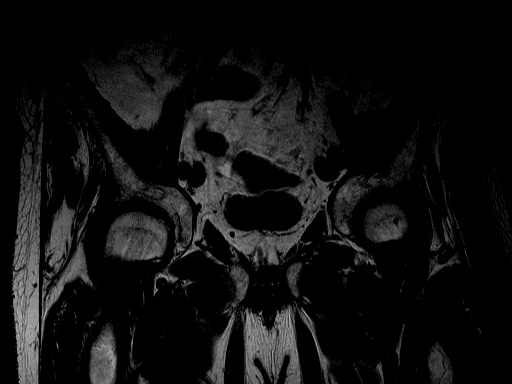
[im 9/20]
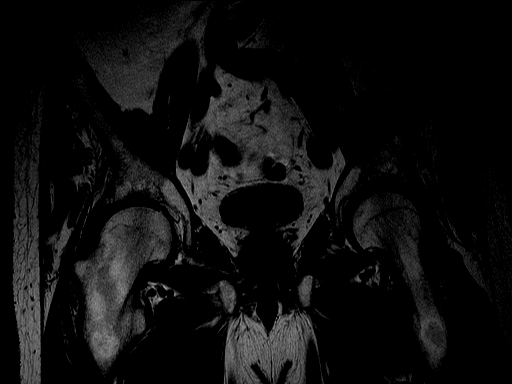
[im 11/20]
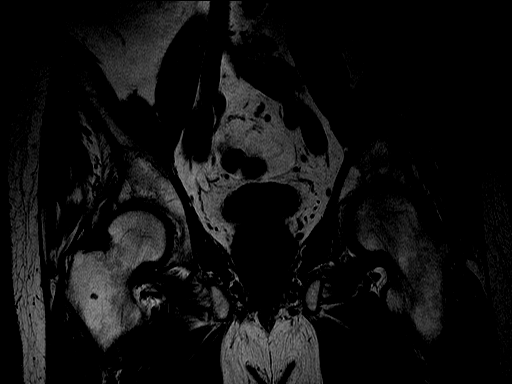
[im 14/20]
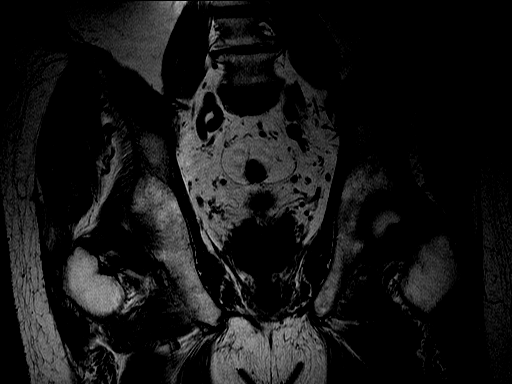
[im 17/20]
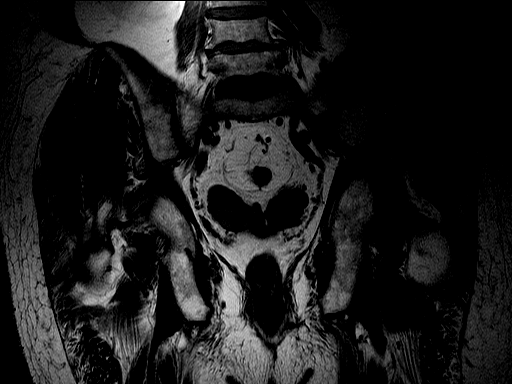
[im 20/20]
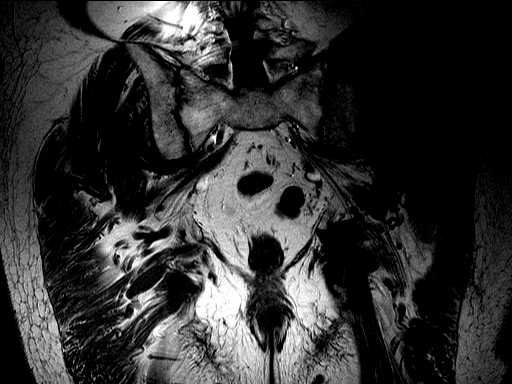

[Series 5: T1 fat-sat · axial · 6.0mm · 0.74mm/px · z∈[-17,+170]mm · 6 of 29 slices shown]
[im 1/29]
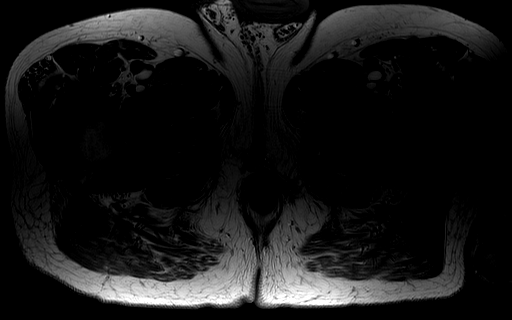
[im 3/29]
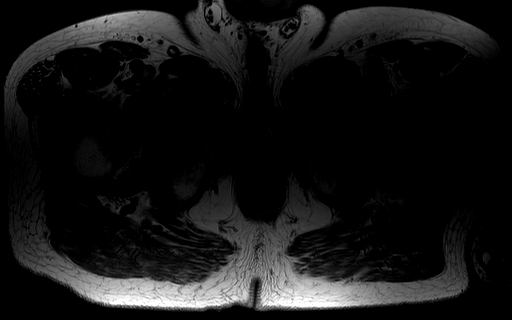
[im 6/29]
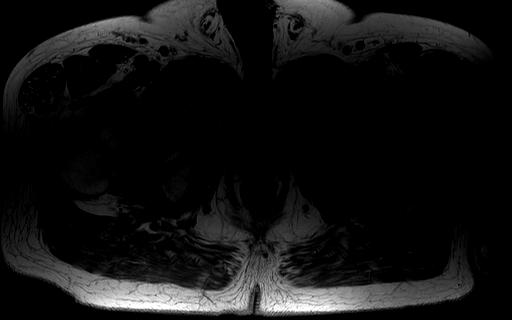
[im 9/29]
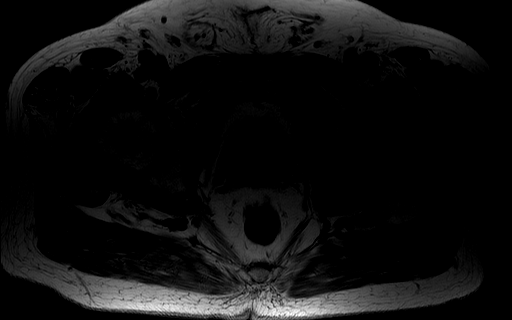
[im 15/29]
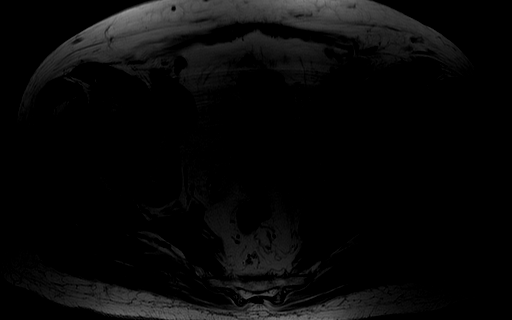
[im 26/29]
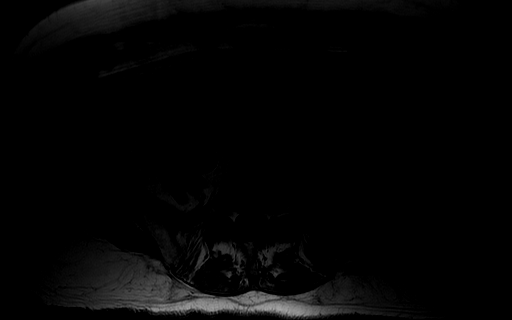

[Series 6: T2 fat-sat · sagittal · 3.5mm · 0.57mm/px · 8 of 21 slices shown (2 of 2)]
[im 1/21]
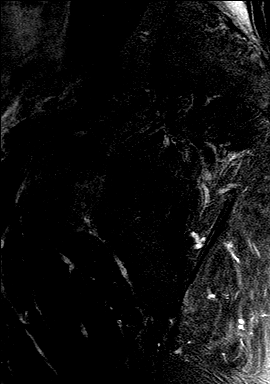
[im 3/21]
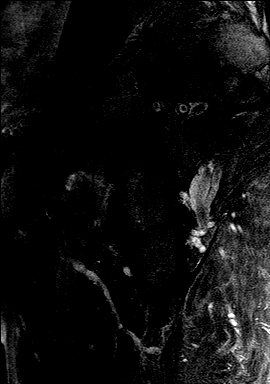
[im 6/21]
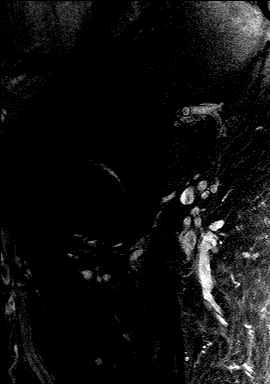
[im 9/21]
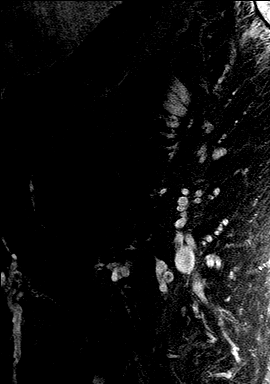
[im 12/21]
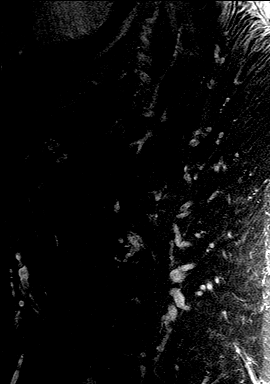
[im 15/21]
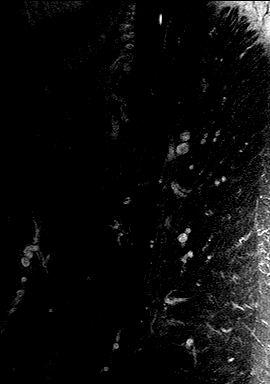
[im 18/21]
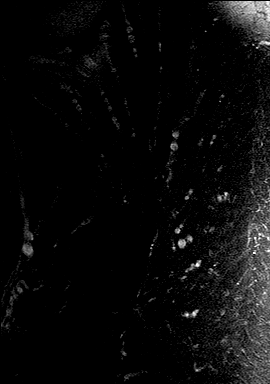
[im 21/21]
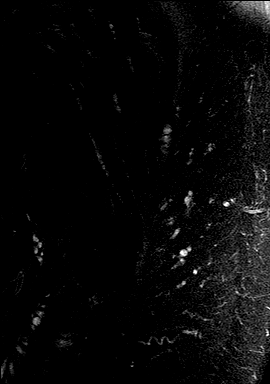

[30 of 48 positions shown; findings below may reference images not displayed]

DIAGNOSTIC STUDIES

EXAM

Right hip MRI.

INDICATION

Suspected Labral tear
PAIN TO RT HIP POSTERIOR IN TO GROIN.  WORSE FIRST THING IN THE MORNING.  RG

TECHNIQUE

Noncontrast multiplanar/multisequence right hip MRI protocol according to institutional standard.

COMPARISONS

September 17, 2019 radiographs.

FINDINGS

Marrow/joint space: Moderate right and mild to moderate left osteoarthropathy with marginal
spurring. Subchondral cystic change of the right superior acetabulum is moderate. There is mild
thinning of the articular cartilage, more pronounced on the right than left. No aggressive osseous
lesion. No avascular necrosis. Osseous bumps are noted at the femoral head neck junction bilaterally
with predisposition towards femoral acetabular impingement. The sacroiliac joints show degenerative
change but no evidence of inflammation to suggest sacroiliitis. No periarticular erosions. No
fracture. No joint effusion. Pubic symphysis degeneration is present. There is lumbar spondylosis
with apex right curvature of the lumbar spine. Facet arthropathy of the lower lumbar spine is also
present.

Labrum: There is degenerative tearing of the labrum bilaterally.

Soft tissues: There is slight edema in the short adductor musculature of the hips bilaterally, more
pronounced on the right than left. There is no evidence of adductor tendon or muscle tear. The hip
flexors are intact without tear. There is no evidence of iliopsoas or trochanteric bursitis. The
gluteal tendons are intact. The hamstring tendons show no compelling evidence of tearing, though
bright T2 signal is present bilaterally suggesting mild tendinopathy. The prostate gland is
unremarkable. The urinary bladder is partially distended and thin-walled. Prominence of the seminal
vesicles noted bilaterally.

IMPRESSION

Bilateral hip osteoarthropathy, right greater than left. Mild tendinosis is suggested in the
hamstring tendons. Slight edema in the short adductor musculature of the pelvis, right greater than
left.

Tech Notes:

PAIN TO RT HIP POSTERIOR IN TO GROIN.  WORSE FIRST THING IN THE MORNING.  RG

## 2020-08-28 IMAGING — CR HIPCMRT
2 series · 2 of 2 positions shown · non-contrast
Comparison: none

[hip ap pelvis]
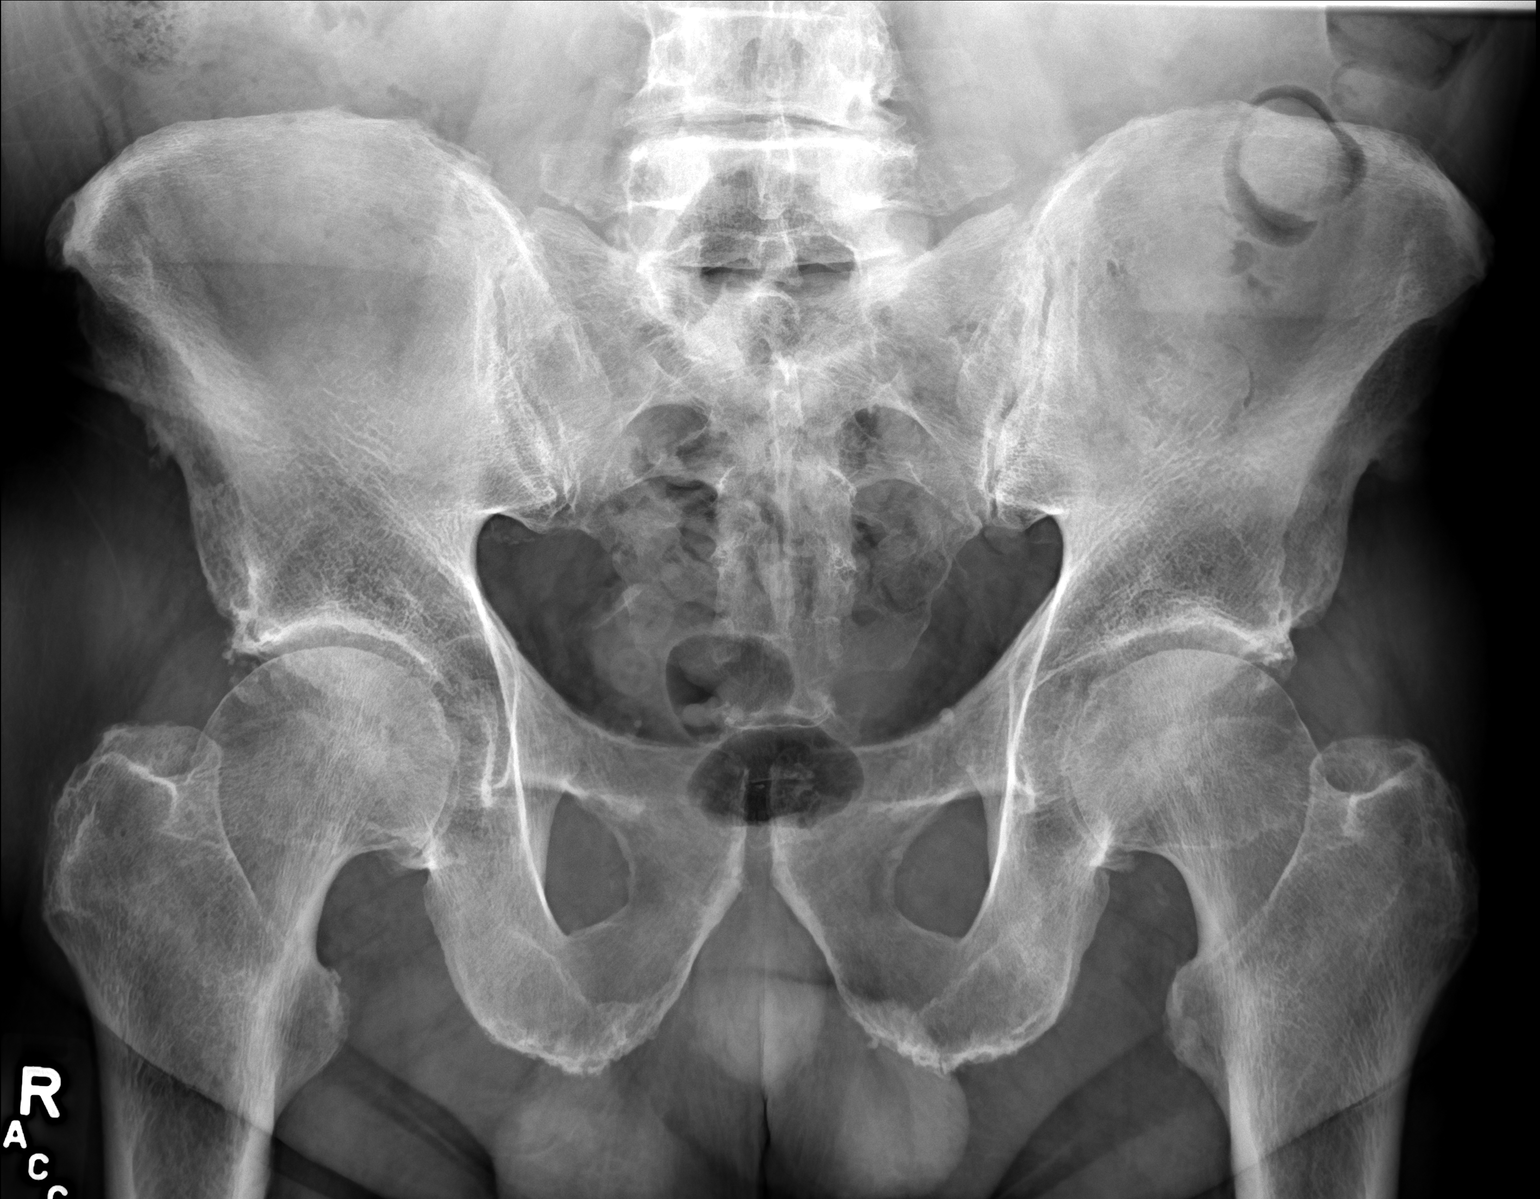

[hip frog lat]
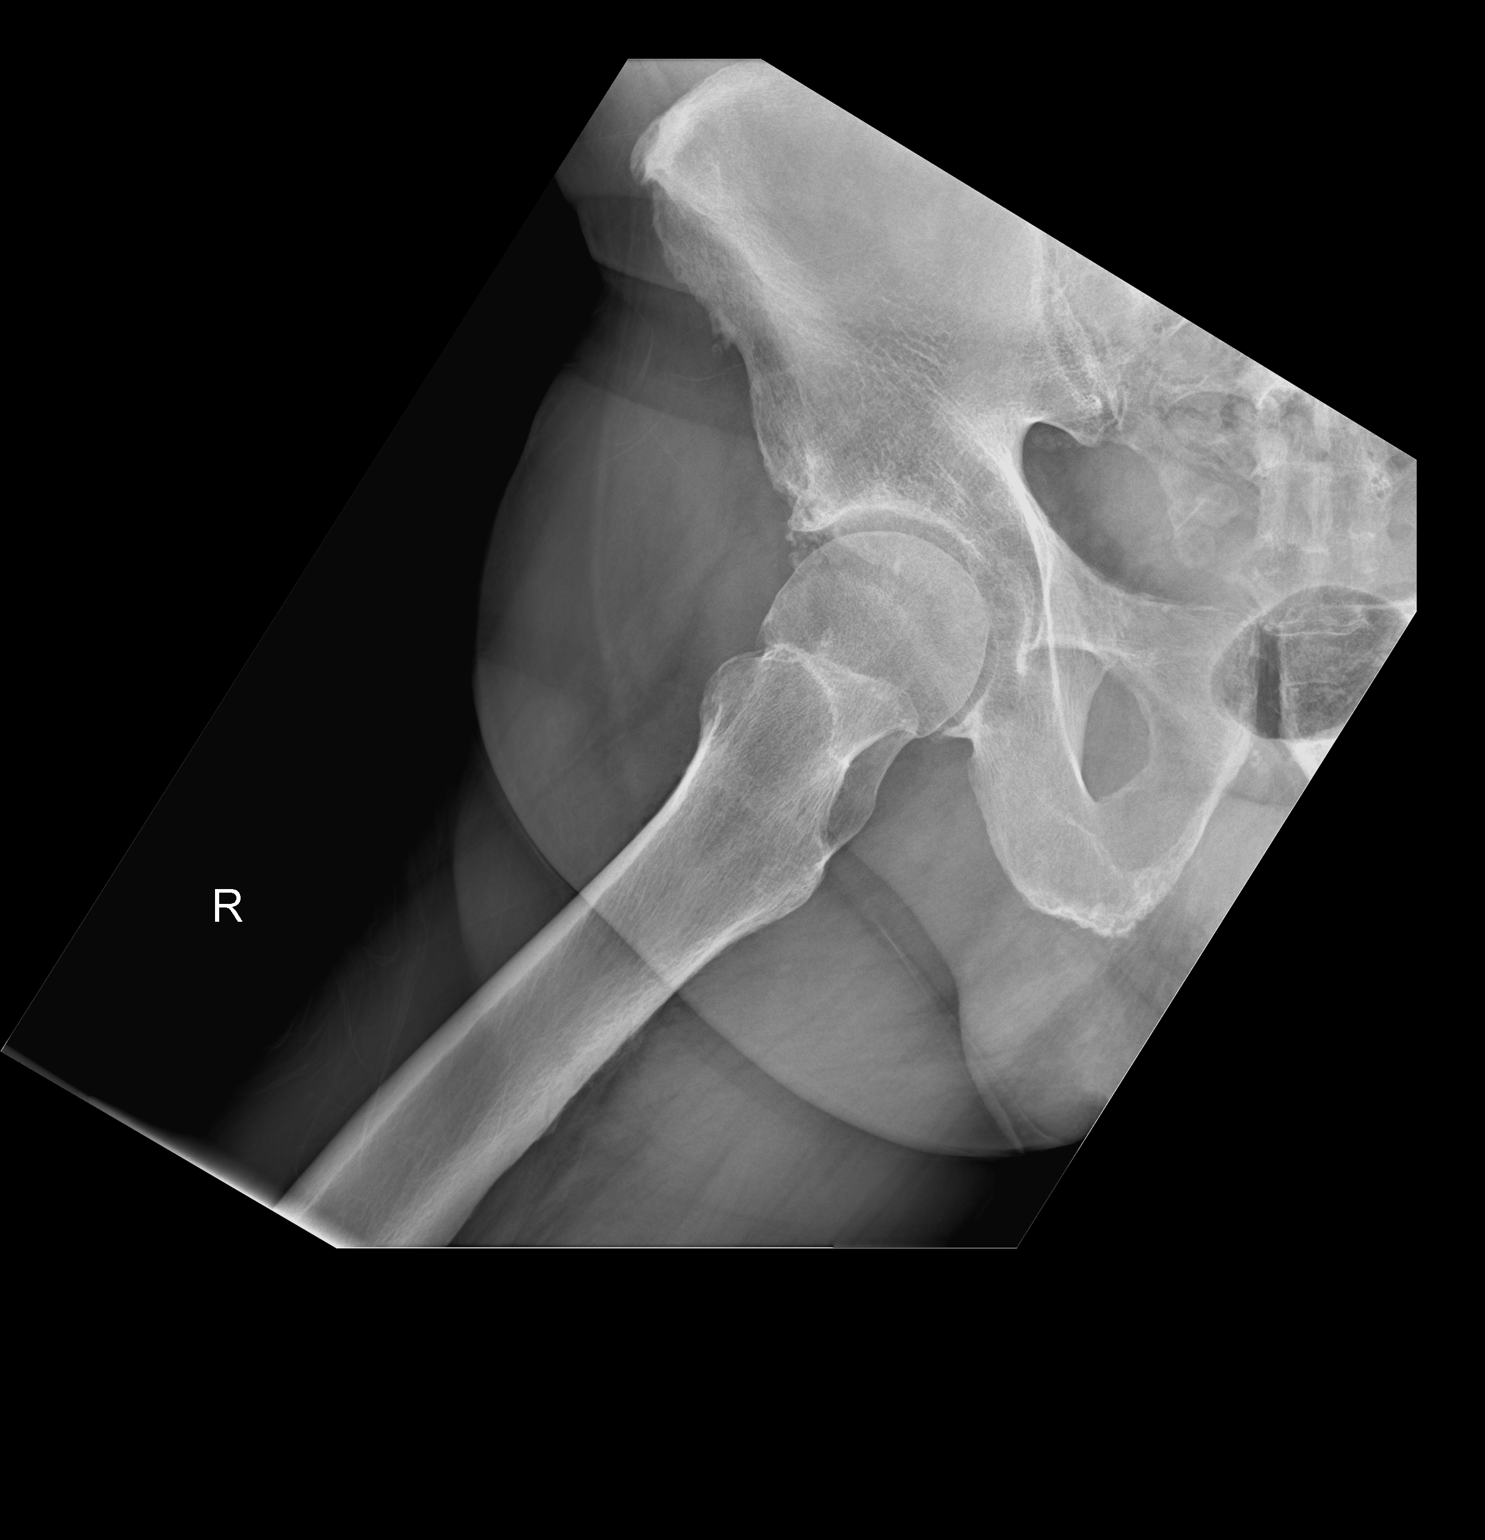

[2 of 2 positions shown; findings below may reference images not displayed]

EXAM

Right hip with pelvis

INDICATION

hiip pain
Pt c/o right hip pain. H/o arthritis. Pt states hip injection x 1 year ago. AC/CF

FINDINGS

Two views of the right hip and a single view of the pelvis were obtained.

There is no evidence of fracture or dislocation of the right hip. There is moderate narrowing of
the right hip joint space with small to moderately sized osteophytes of the right acetabulum and
right femoral head.

There is no acute abnormality of the pelvis.

IMPRESSION

There is moderate osteoarthrosis of the right hip with no fracture or acute appearing abnormality
of the right hip or pelvis.

Tech Notes:

Pt c/o right hip pain. H/o arthritis. Pt states hip injection x 1 year ago. AC/CF

## 2021-02-19 IMAGING — US ADUPLEBABI
1 series · 14 of 25 positions shown · non-contrast
Comparison: none

[Series 1: us arterial dup bil leg w abi · arterial · 14 of 80 slices shown]
[im 1/80]
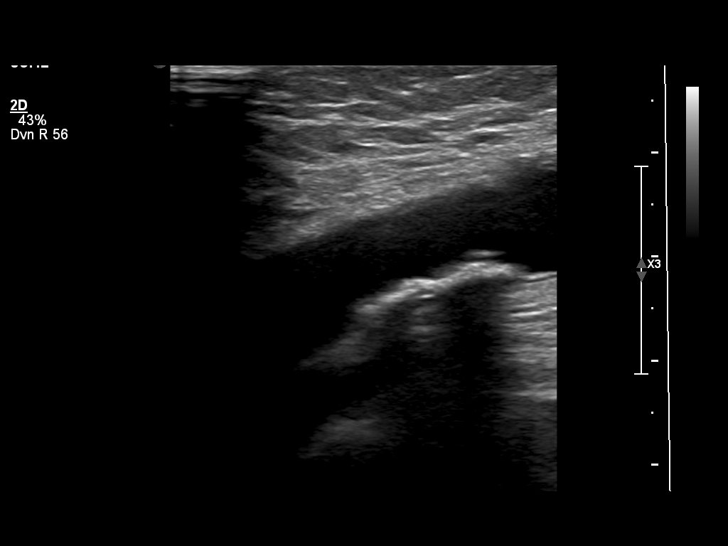
[im 7/80]
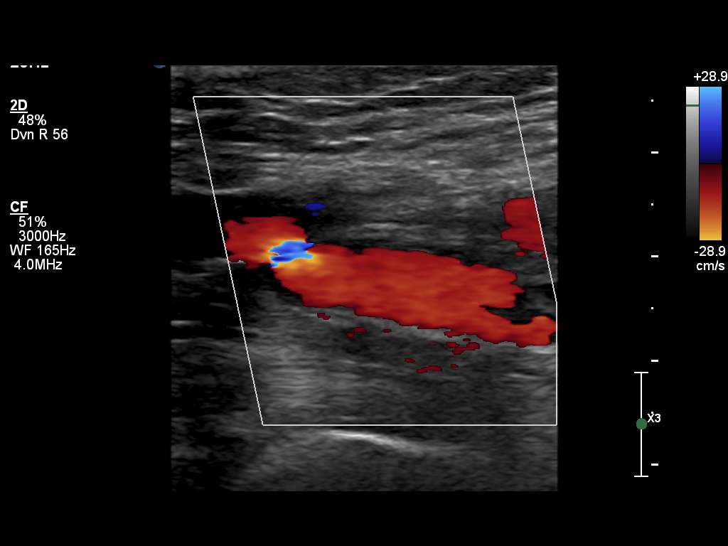
[im 14/80]
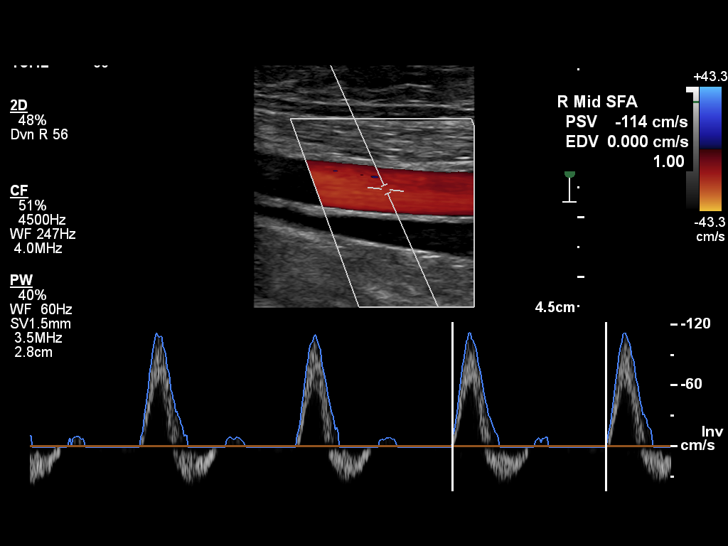
[im 20/80]
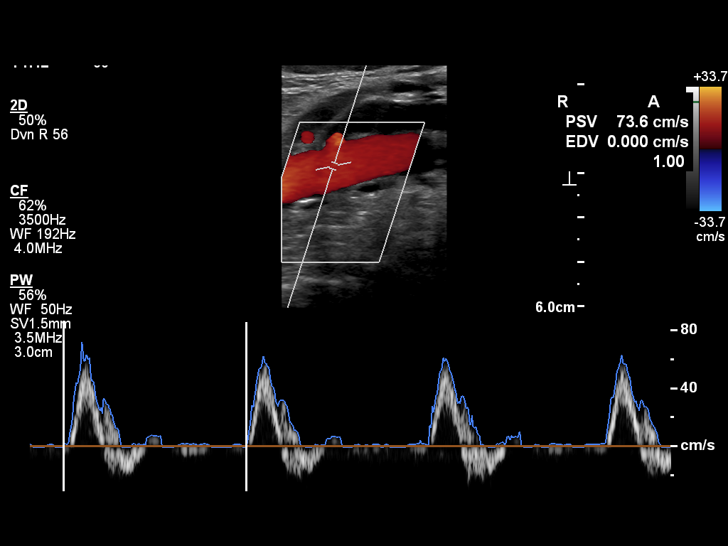
[im 27/80]
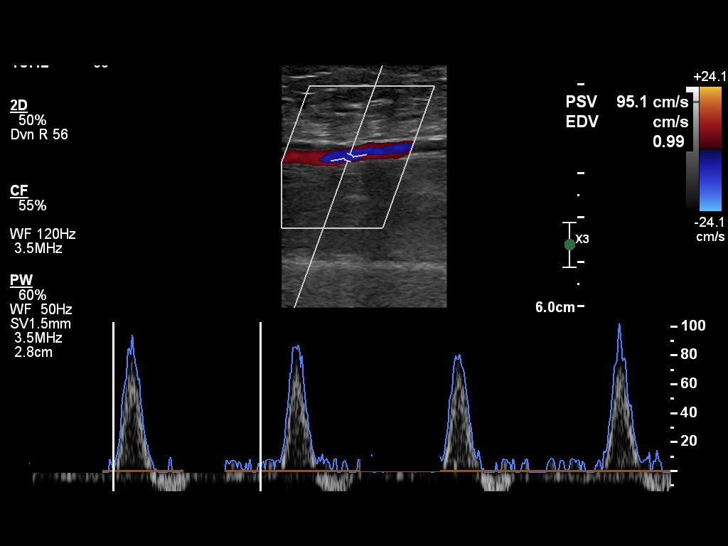
[im 30/80]
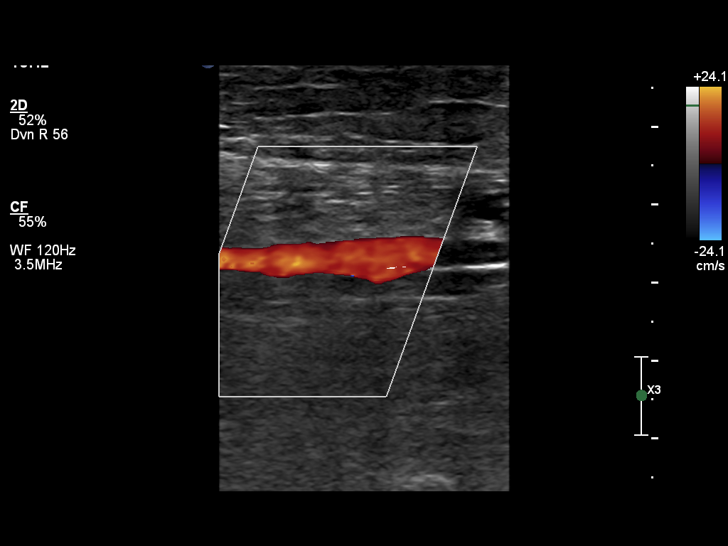
[im 37/80]
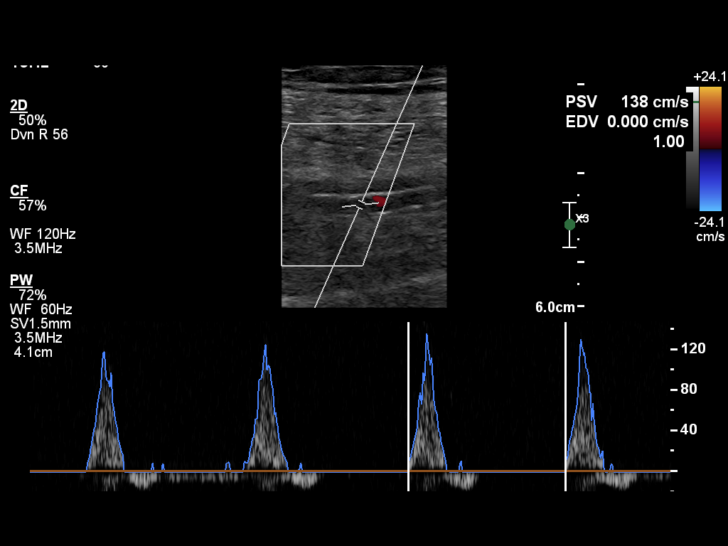
[im 43/80]
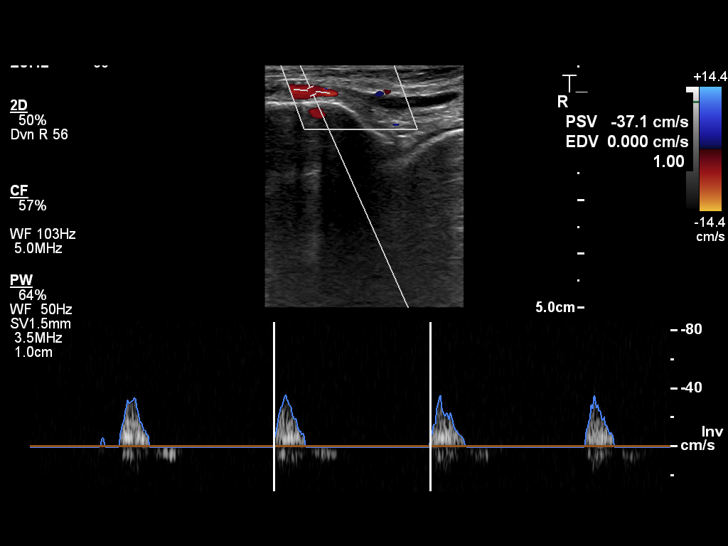
[im 50/80]
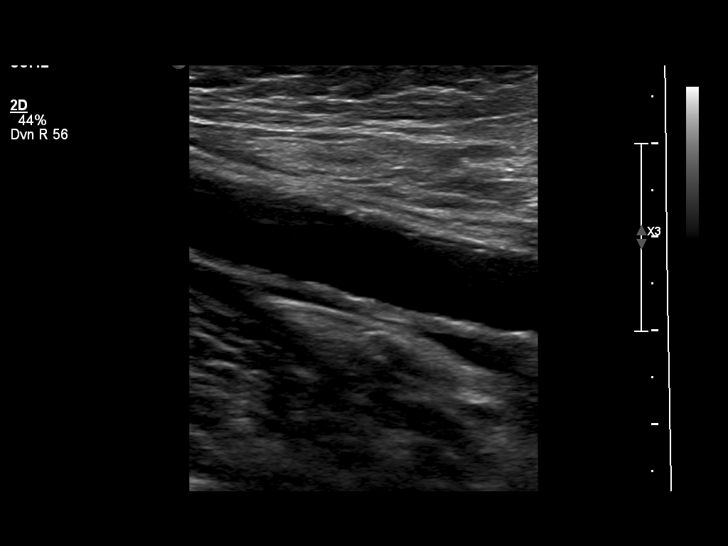
[im 53/80]
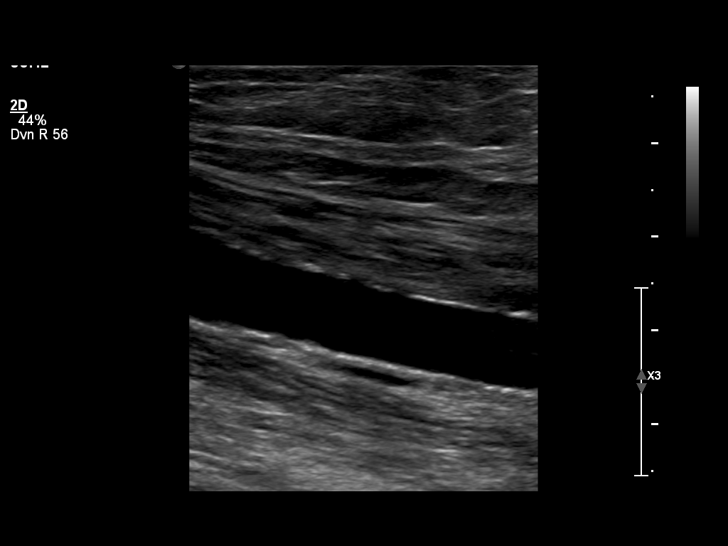
[im 60/80]
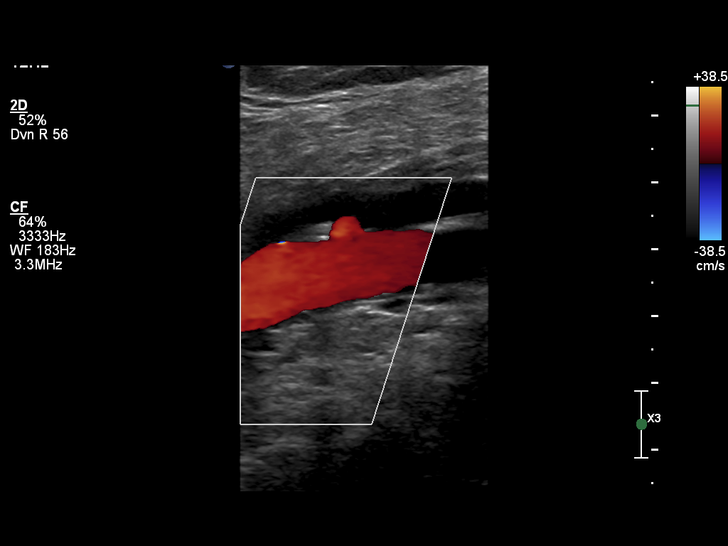
[im 66/80]
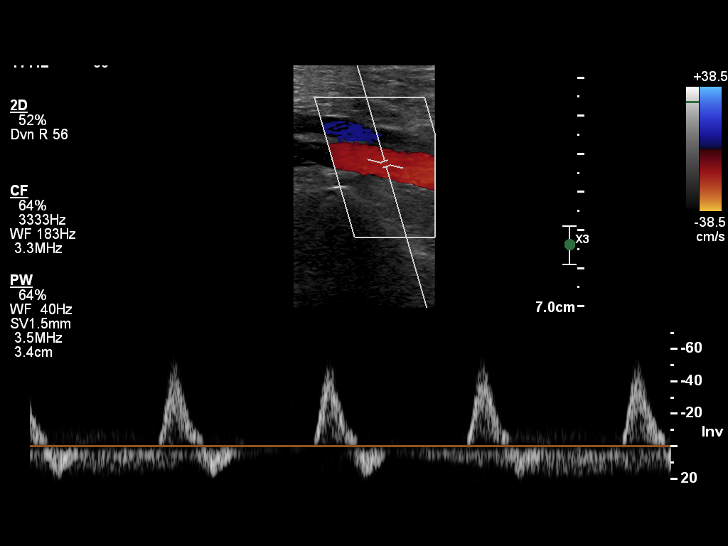
[im 73/80]
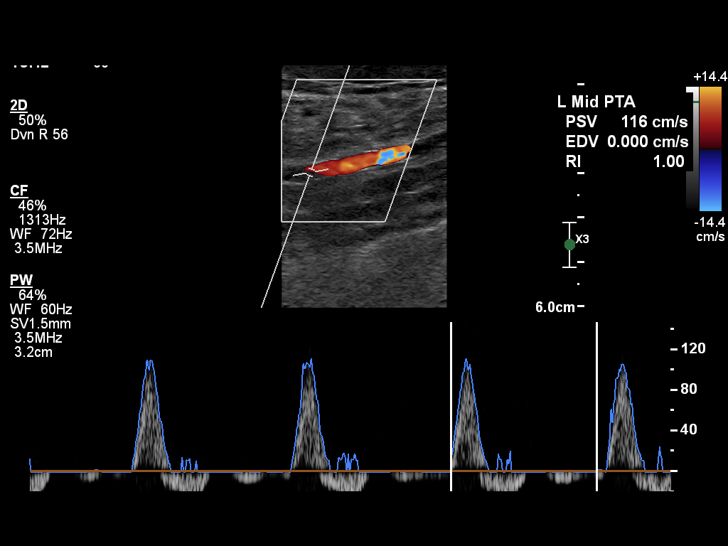
[im 80/80]
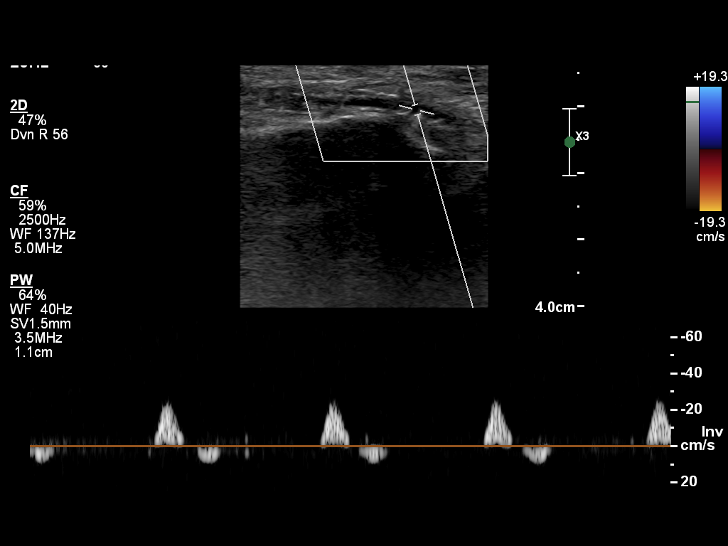

[14 of 25 positions shown; findings below may reference images not displayed]

DIAGNOSTIC STUDIES

EXAM

Ankle-brachial indices with bilateral lower extremity arterial Doppler ultrasound.

INDICATION

weakness
BIL LEG WEAKNESS; HX OF CLAUDICATION AFTER 1 MILE PER PT; HX OF HTN

TECHNIQUE

High-resolution duplex imaging of the lower extremities bilaterally was obtained. This includes
color, Doppler, and spectral analysis.

COMPARISONS

None available

FINDINGS

Right toe index is 1.22. Left 0 in PACs is 1.0.

Right lower extremity: There is triphasic flow throughout the right lower extremity with biphasic
flow in the distal posterior tibial artery and dorsalis pedis. No elevated systolic velocities are
seen.

Left lower extremity:
Triphasic and biphasic flow is evident throughout the left lower extremity without evidence for
elevated systolic velocities.

IMPRESSION

No evidence for significant arterial stenosis throughout either lower extremity.

Tech Notes:

BIL LEG WEAKNESS; HX OF CLAUDICATION AFTER 1 MILE PER PT; HX OF HTN

## 2021-06-11 IMAGING — MR L-spine^Routine
5 series · 33 of 48 positions shown · non-contrast
Comparison: none

[Series 2: T2 · sagittal · 4.0mm · 0.62mm/px · 6 of 13 slices shown (1 of 2)]
[im 1/13]
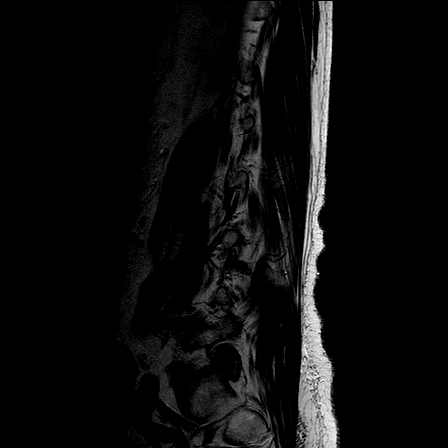
[im 3/13]
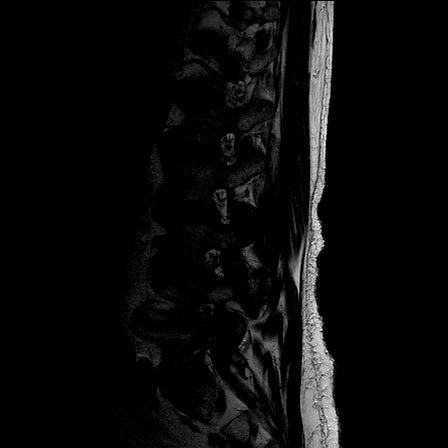
[im 5/13]
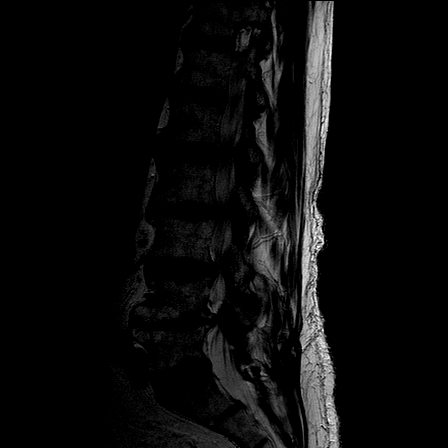
[im 8/13]
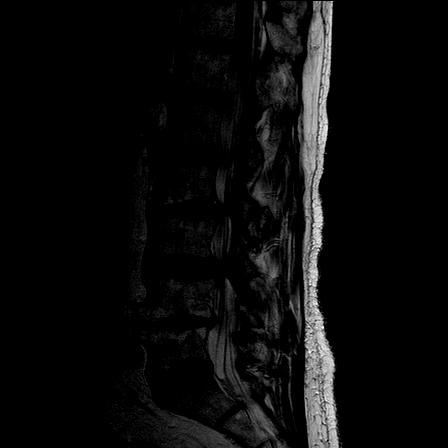
[im 10/13]
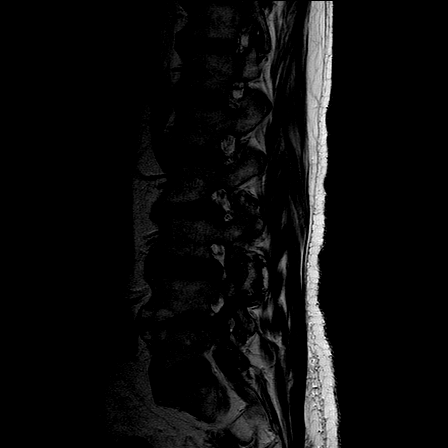
[im 13/13]
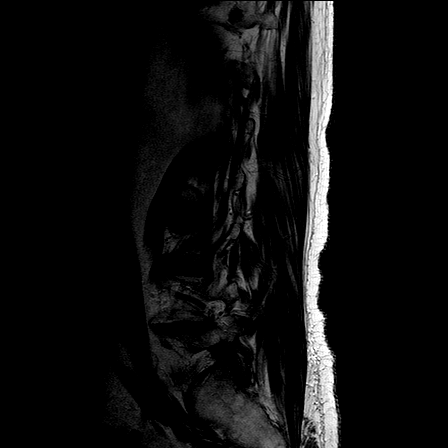

[Series 3: T1 · sagittal · 4.0mm · 0.73mm/px · 6 of 12 slices shown (1 of 2)]
[im 1/12]
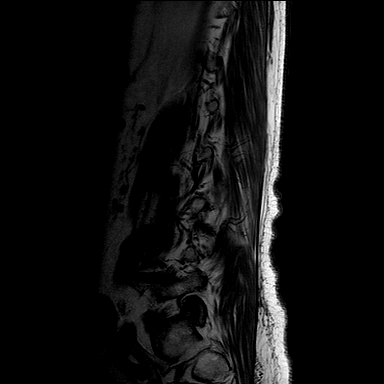
[im 3/12]
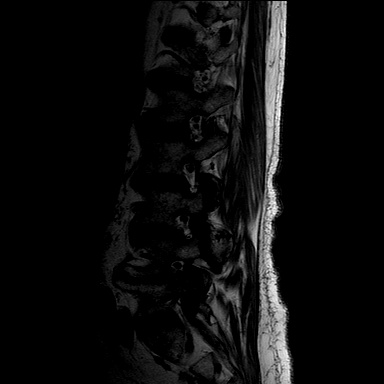
[im 5/12]
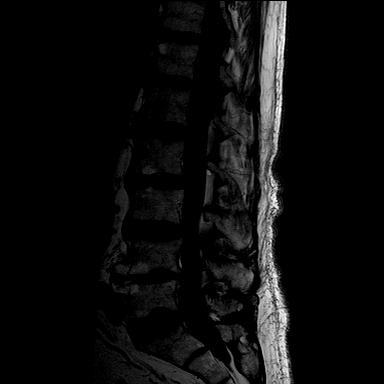
[im 7/12]
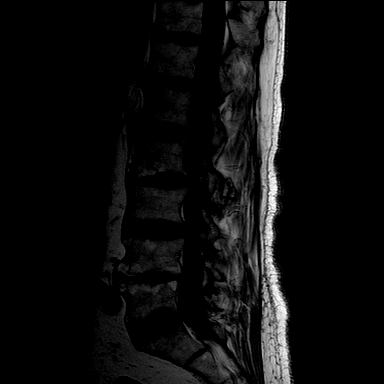
[im 9/12]
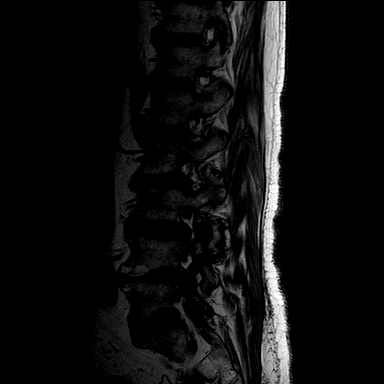
[im 12/12]
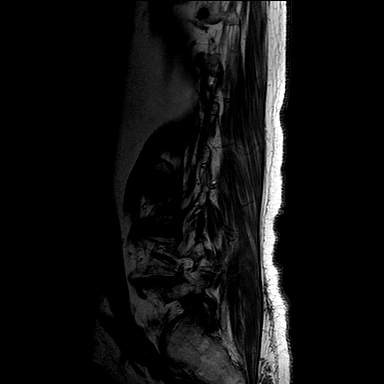

[Series 4: STIR · sagittal · 4.0mm · 0.55mm/px · 4 of 13 slices shown]
[im 1/13]
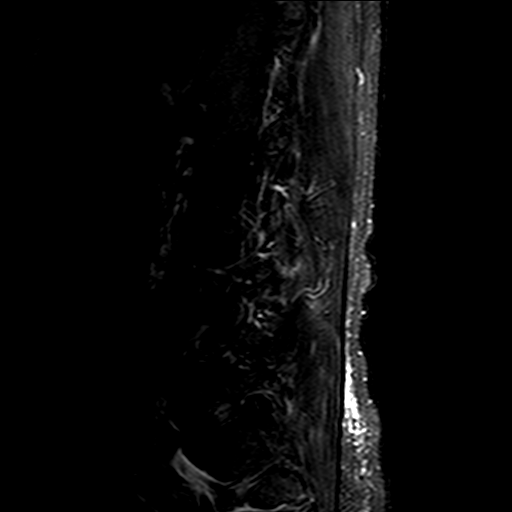
[im 3/13]
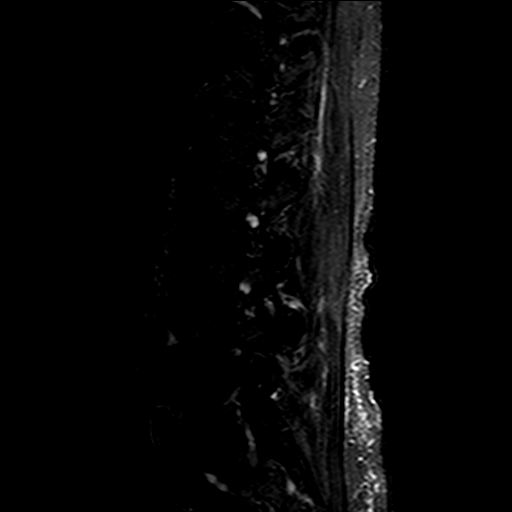
[im 5/13]
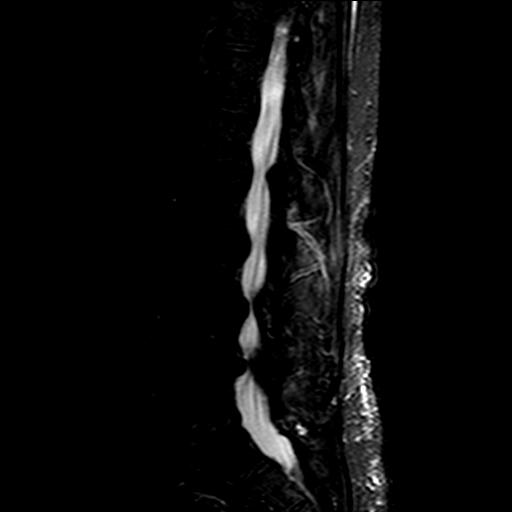
[im 7/13]
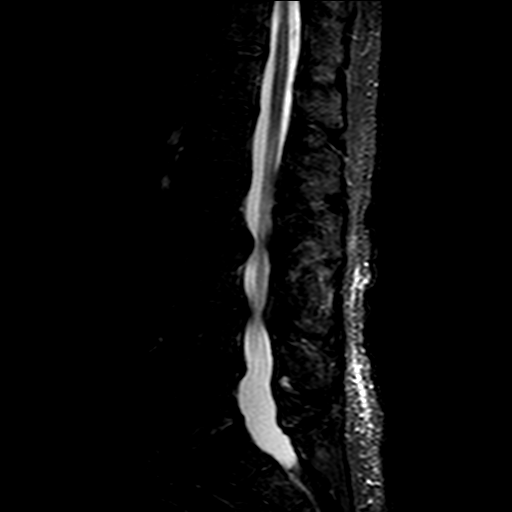

[Series 5: T2 · axial · 4.5mm · 0.49mm/px · z∈[-154,+32]mm · 9 of 29 slices shown (2 of 2)]
[im 1/29]
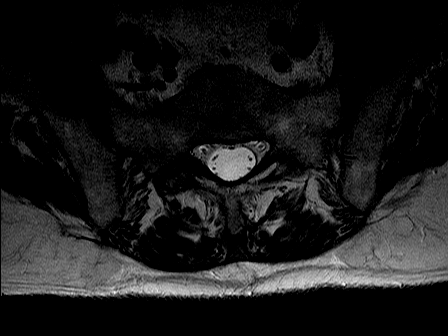
[im 5/29]
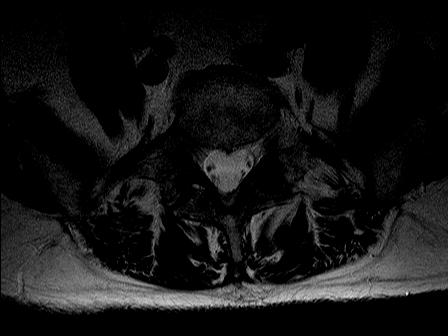
[im 9/29]
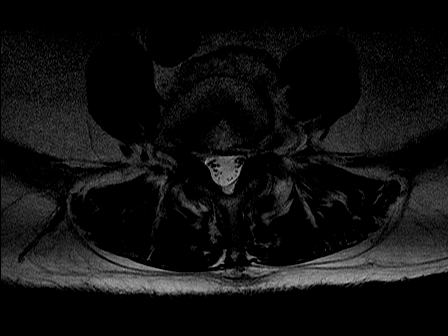
[im 13/29]
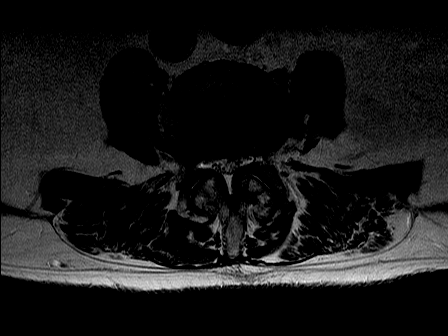
[im 15/29]
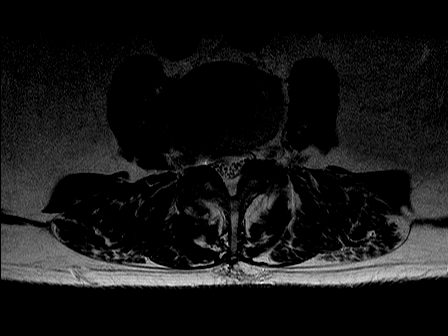
[im 17/29]
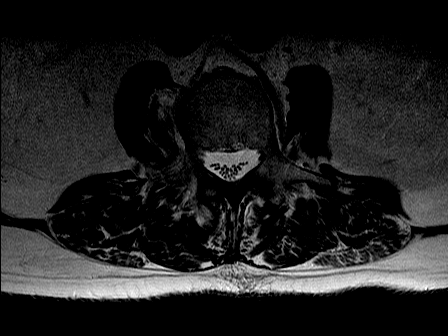
[im 21/29]
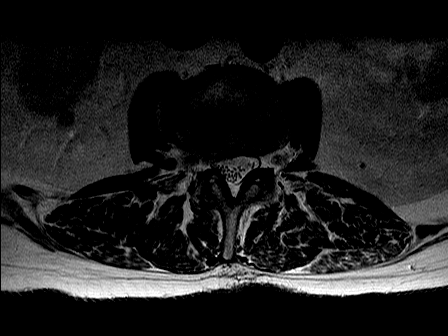
[im 25/29]
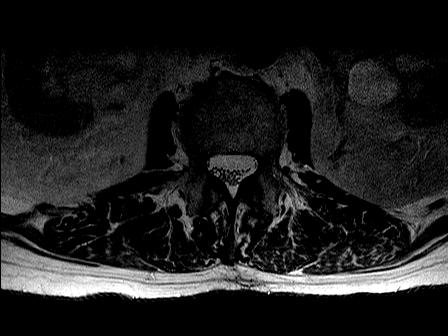
[im 29/29]
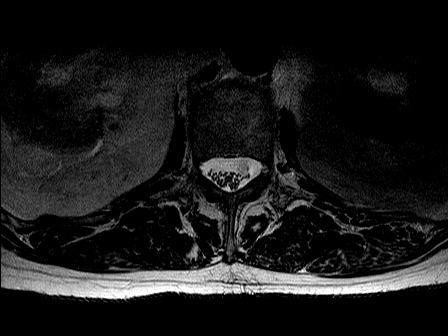

[Series 6: T1 · axial · 4.5mm · 0.86mm/px · z∈[-154,+32]mm · 8 of 28 slices shown (2 of 2)]
[im 1/28]
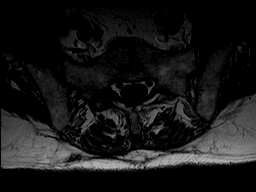
[im 5/28]
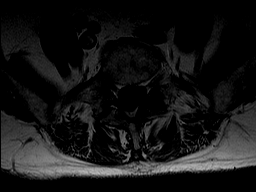
[im 9/28]
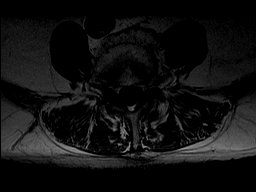
[im 13/28]
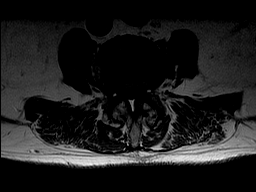
[im 15/28]
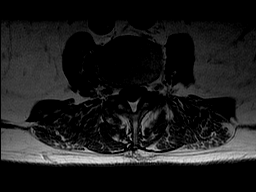
[im 19/28]
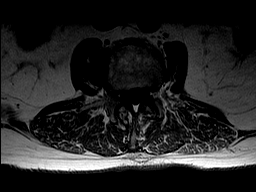
[im 23/28]
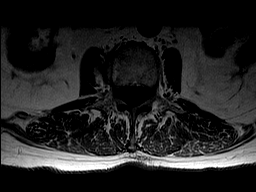
[im 28/28]
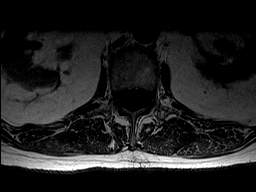

[33 of 48 positions shown; findings below may reference images not displayed]

DIAGNOSTIC STUDIES

EXAM

MRI lumbar spine without contrast.

INDICATION

LBP
LOW BACK PAIN SINCE NOVEMBER, LEFT FOOT DROP, GAIT IMBALANCE.   PT DID HAVE RT HIP PAIN, GOT
INJECTION, IMPROVED, BUT NOW HAS LBP, LEFT FOOT DROP.  RG

TECHNIQUE

Sagittal axial images were obtained with variable T1 and T2 weighting.

COMPARISONS

None available

FINDINGS

There are no plain films. Patient is assumed to have 5 lumbar type vertebral bodies. There is
slight straightening of lumbar lordosis probably due to positioning or spasm. Conus medullaris is
normal in signal intensity and location. The L1-2 disc is normal with mild facet hypertrophy at L1-
2. No central canal or neural foraminal stenosis is evident.

L2-3: Disc bulging and facet hypertrophy is seen greater on the right than left resulting in mild
central canal narrowing. The neural foramina well preserved.

L3-4: Disc bulging and facet hypertrophy results in mild central canal narrowing and minimal neural
foraminal stenosis bilaterally.

L4-5: Loss of height and disc bulging is seen with prominent anterior osteophytic spurring.
Bilateral facet hypertrophy is noted. There is moderate neural foraminal stenosis bilaterally.

L5-S1: Slight disc bulging and facet hypertrophy is noted without central canal or neural foraminal
stenosis.

IMPRESSION

Disc bulging facet hypertrophy at multiple lumbar levels. This results in varying degrees of
central canal and neural foraminal stenosis. Please see above discussion for individual levels.

No compression fractures are seen.

Tech Notes:

LOW BACK PAIN SINCE NOVEMBER, LEFT FOOT DROP, GAIT IMBALANCE.   PT DID HAVE RT HIP PAIN, GOT
INJECTION, IMPROVED, BUT NOW HAS LBP, LEFT FOOT DROP.  RG

## 2021-11-23 ENCOUNTER — Encounter: Admit: 2021-11-23 | Discharge: 2021-11-23 | Payer: MEDICARE

## 2021-11-23 NOTE — Patient Instructions
.  --   Preferred method of communication is through [employer], if the issue cannot wait until your next scheduled follow up.   -- MyChart may be used for non-emergent communication. MyChart messages are not reviewed after hours or over the weekend/holidays/after 4PM. Staff will reply to your email within 24-48 business hours.       -- If you do not hear from [place] within one week of a lab or imaging study being completed, please call/send MyChart message to the office to be sure that we have received the results. This is especially challenging when tests are done outside of the [facility] system, as many times results do not make it back to our office for a variety of reasons. In our office no news is good news does not apply. You should hear from [place] with results for each test.  [facility] lab/imaging results:  Due to the CARES act, results automatically release to MyChart.  Dr. Assunta Curtis will continue to send you a result note on any labs that he orders.  With these changes, you may see your results before Dr. Assunta Curtis does. Please allow up to 72 hours for review and response to your results.     -- If you are having acute (new/sudden onset) or severe/worsening neurologic symptoms, please call 911 or seek care in ED.    -- For scheduling of IMAGING/RADIOLOGY, please call [phone] at your convenience to schedule your studies.  -- For referrals placed during the visit, if you have not heard from scheduling within one week, please call the call center at [phone] to get scheduling assistance.  -- For refills on medications, please first contact your pharmacy, who will fax a refill authorization request form to our office.  Weekdays only. Allow up to 2 business days for refills. Please plan ahead, as refills will not be filled after hours.  -- Our scheduling staff, Mardella Layman, may be contacted [phone] for scheduling needs.   -- Gerri Lins, RN, may be contacted at [phone] for urgent needs. Staff will return your call within 24 business hours.     For Appointments:   -- Please try to arrive early for your appointment time to help facilitate your visit. 15 minutes early is recommended.   -- If you are late to your appointment, we reserve the right to ask you to reschedule or wait until next available time to be seen in fairness to other patients scheduled that day.   -- There are times when we are running behind in clinic. Our goal is to always be on time, however, there are time when unexpected events occur with patients, which may cause a delay. We appreciate your understanding when this occurs.

## 2021-11-24 ENCOUNTER — Encounter: Admit: 2021-11-24 | Discharge: 2021-11-24 | Payer: MEDICARE

## 2021-11-24 ENCOUNTER — Ambulatory Visit: Admit: 2021-11-24 | Discharge: 2021-11-25 | Payer: MEDICARE

## 2021-11-24 VITALS — BP 150/75 | HR 123 | Temp 98.30000°F | Resp 18 | Ht 70.0 in | Wt 221.0 lb

## 2021-11-24 DIAGNOSIS — M1990 Unspecified osteoarthritis, unspecified site: Secondary | ICD-10-CM

## 2021-11-24 DIAGNOSIS — R799 Abnormal finding of blood chemistry, unspecified: Secondary | ICD-10-CM

## 2021-11-24 DIAGNOSIS — I519 Heart disease, unspecified: Secondary | ICD-10-CM

## 2021-11-24 DIAGNOSIS — E785 Hyperlipidemia, unspecified: Secondary | ICD-10-CM

## 2021-11-24 DIAGNOSIS — J45909 Unspecified asthma, uncomplicated: Secondary | ICD-10-CM

## 2021-11-24 DIAGNOSIS — G4730 Sleep apnea, unspecified: Secondary | ICD-10-CM

## 2021-11-24 DIAGNOSIS — Z95828 Presence of other vascular implants and grafts: Secondary | ICD-10-CM

## 2021-11-24 DIAGNOSIS — I10 Essential (primary) hypertension: Secondary | ICD-10-CM

## 2021-11-24 DIAGNOSIS — A6920 Lyme disease, unspecified: Secondary | ICD-10-CM

## 2021-11-24 DIAGNOSIS — H409 Unspecified glaucoma: Secondary | ICD-10-CM

## 2021-11-24 DIAGNOSIS — Z9109 Other allergy status, other than to drugs and biological substances: Secondary | ICD-10-CM

## 2021-11-24 DIAGNOSIS — Z9989 Dependence on other enabling machines and devices: Secondary | ICD-10-CM

## 2021-11-24 LAB — VITAMIN B12: VITAMIN B12: 615 pg/mL (ref 180–914)

## 2021-11-24 LAB — CREATINE KINASE-CPK: CK TOTAL: 246 U/L — ABNORMAL HIGH (ref 35–232)

## 2021-11-25 DIAGNOSIS — R29898 Other symptoms and signs involving the musculoskeletal system: Principal | ICD-10-CM

## 2021-11-30 IMAGING — CR SHOULDCMRT
4 series · 4 of 4 positions shown · non-contrast
Comparison: none

[w shoulder external right]
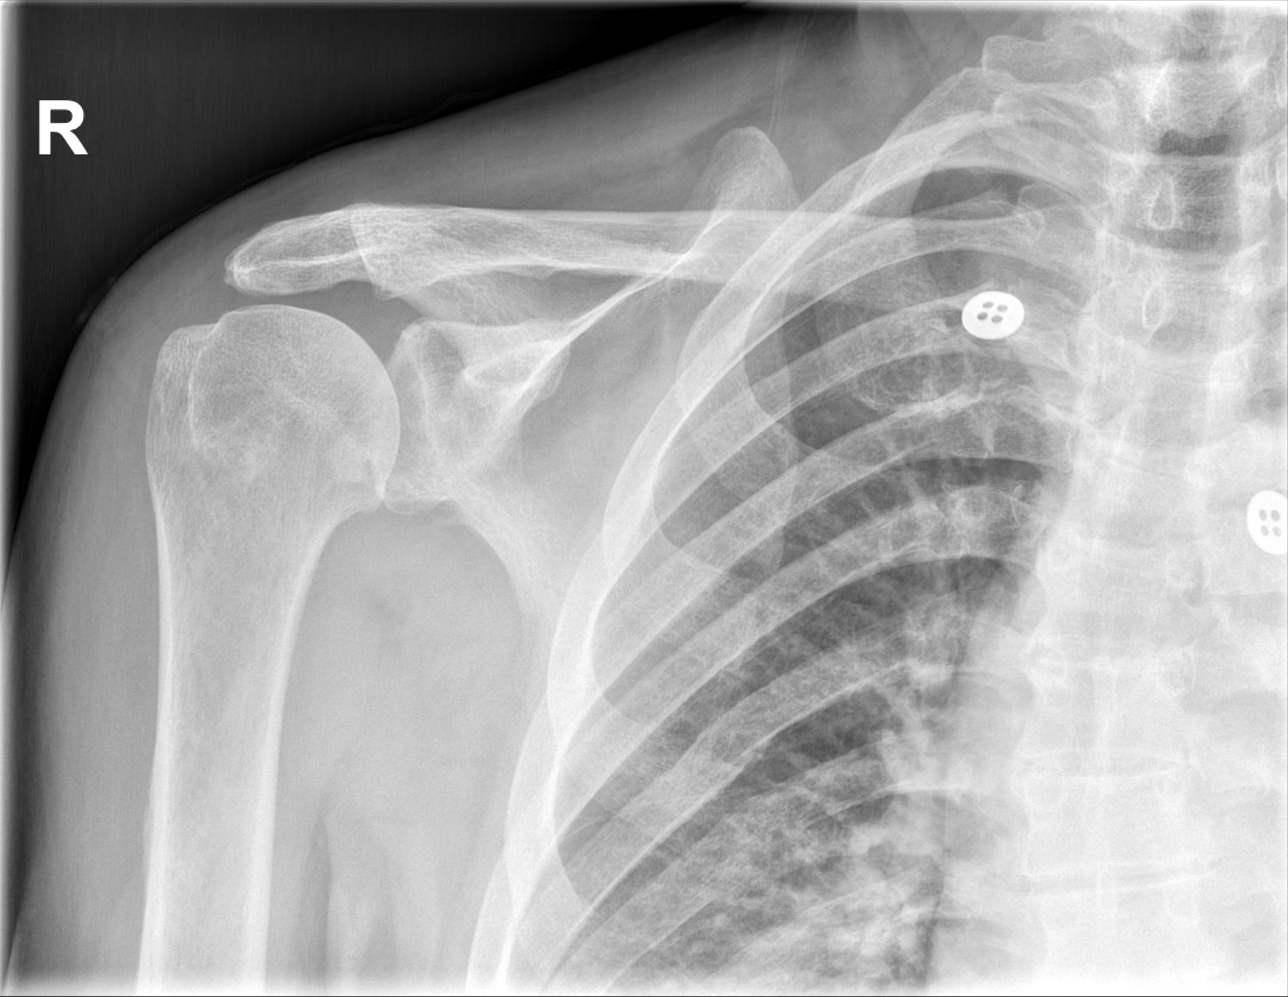

[w shoulder internal right]
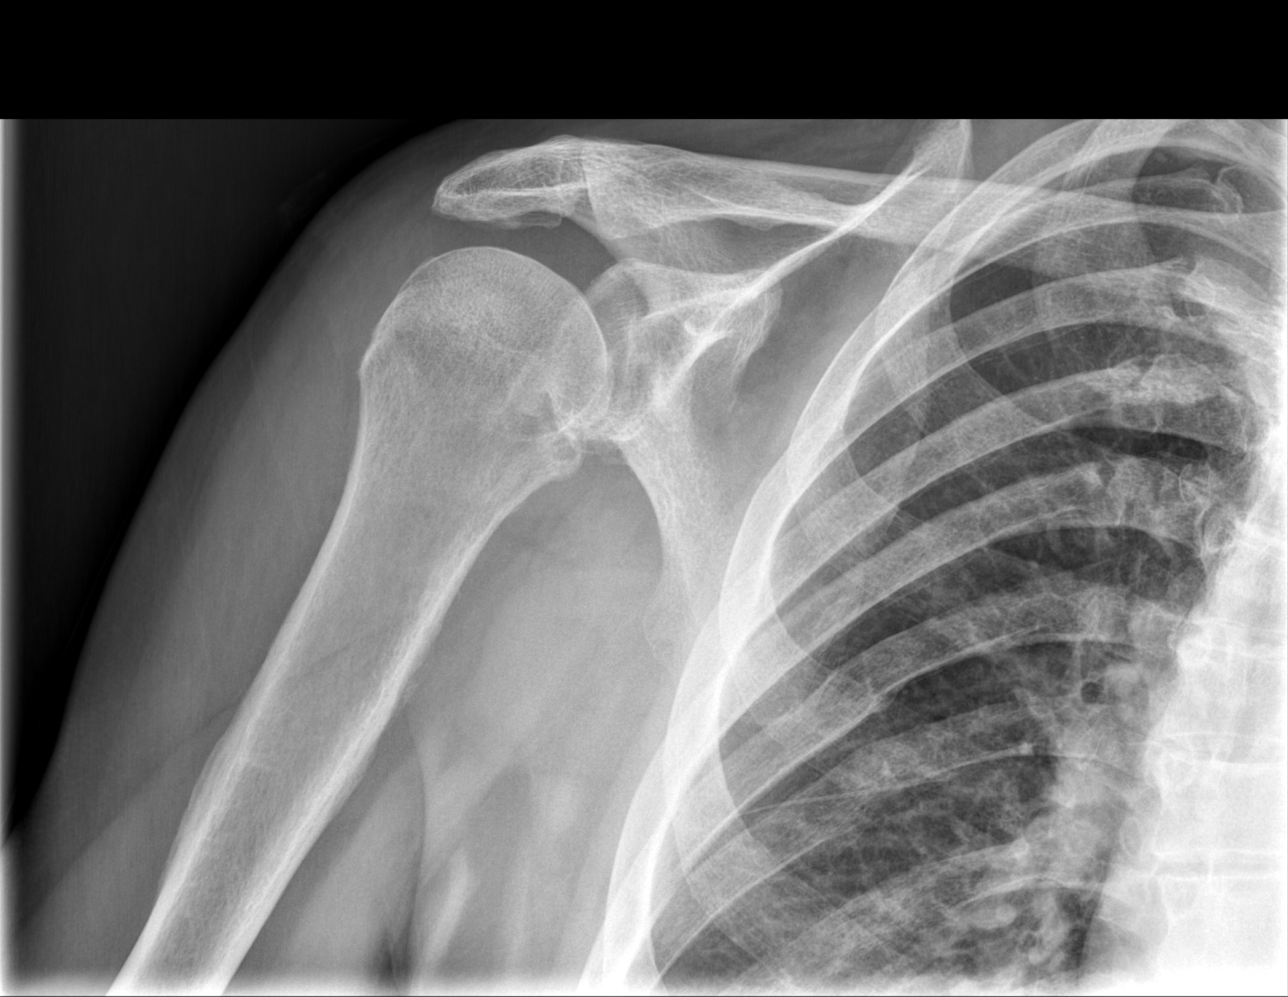

[w shoulder y-view ap right]
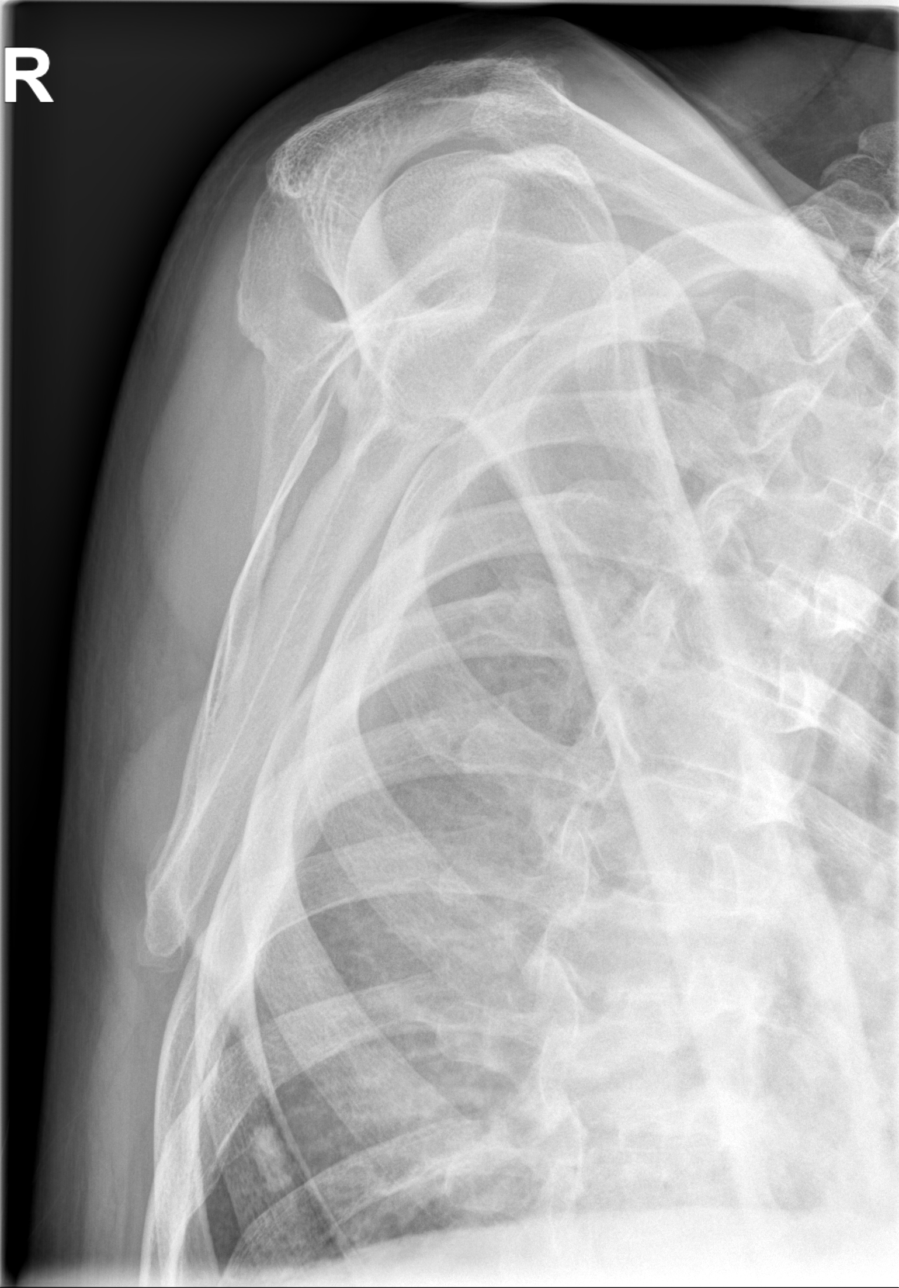

[x shoulder axial right]
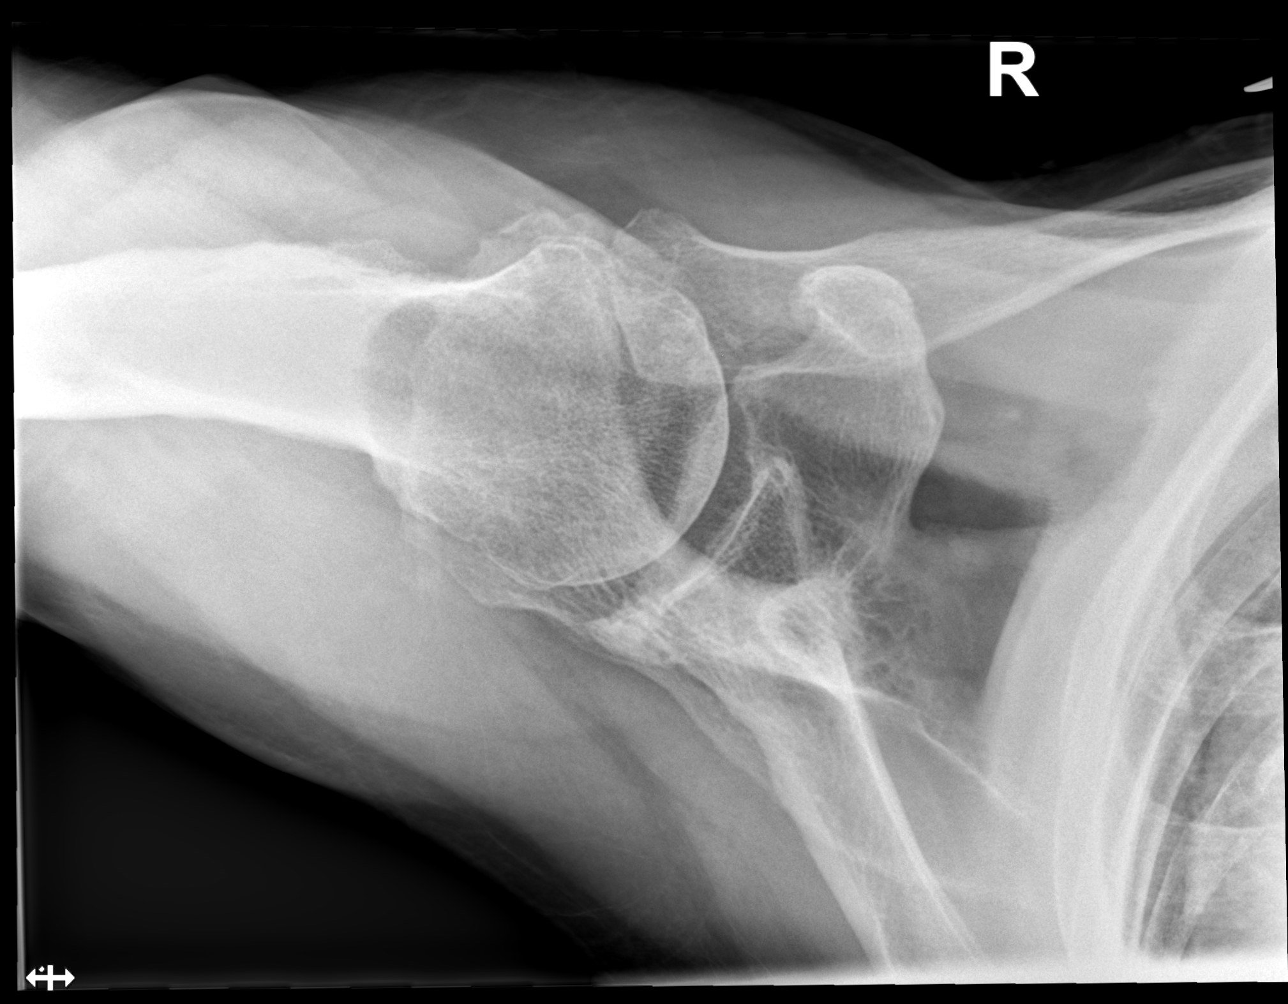

[4 of 4 positions shown; findings below may reference images not displayed]

DIAGNOSTIC STUDIES

EXAM

RIGHT SHOULDER

INDICATION

right shoulder pain
C/O RT SHOULDER PAIN. FALL X 6 WEEKS AGO, PAIN WAS IMPROVING, BUT FELT POP YESTERDAY AND PAIN
INCREASED. DECREASED ROM.   HB

TECHNIQUE

Internal rotation, external rotation, Y, and axillary views of the right shoulder

COMPARISONS

None

FINDINGS

No dislocation or acute bony injury/fracture is identified.  Minimal degenerative changes are
present. Bony structures are otherwise unremarkable. Old fracture deformity of the posterior right
7th rib is identified. Adjacent soft tissue structures are unremarkable.

IMPRESSION

No acute bony injury of the  right shoulder.

Tech Notes:

C/O RT SHOULDER PAIN. FALL X 6 WEEKS AGO, PAIN WAS IMPROVING, BUT FELT POP YESTERDAY AND PAIN
INCREASED. DECREASED ROM.
HB

## 2022-01-05 ENCOUNTER — Encounter: Admit: 2022-01-05 | Discharge: 2022-01-05 | Payer: MEDICARE

## 2022-01-06 ENCOUNTER — Encounter: Admit: 2022-01-06 | Discharge: 2022-01-06 | Payer: MEDICARE

## 2022-01-06 ENCOUNTER — Ambulatory Visit: Admit: 2022-01-06 | Discharge: 2022-01-06 | Payer: MEDICARE

## 2022-01-06 DIAGNOSIS — R29898 Other symptoms and signs involving the musculoskeletal system: Secondary | ICD-10-CM

## 2022-01-06 NOTE — Progress Notes
EMG/NCS Procedure Time Out Check List:  Prior to the start of the procedure, I personally confirmed the following:      Patient Identity (name & date of birth): Yes  Procedure: Yes  Site: Yes  Body Part: Yes    Time out was performed. On the verbal pain analog scale of 0 to 10, patient's pain level was 0 before the procedure and 0 afterwards. Test was completed without complication.     The risks of the procedure, including pain, were discussed with the patient.

## 2022-01-06 NOTE — Patient Instructions
.  --   Preferred method of communication is through [employer], if the issue cannot wait until your next scheduled follow up.   -- MyChart may be used for non-emergent communication. MyChart messages are not reviewed after hours or over the weekend/holidays/after 4PM. Staff will reply to your email within 24-48 business hours.       -- If you do not hear from [place] within one week of a lab or imaging study being completed, please call/send MyChart message to the office to be sure that we have received the results. This is especially challenging when tests are done outside of the [facility] system, as many times results do not make it back to our office for a variety of reasons. In our office no news is good news does not apply. You should hear from [place] with results for each test.  [facility] lab/imaging results:  Due to the CARES act, results automatically release to MyChart.  Dr. Assunta Curtis will continue to send you a result note on any labs that he orders.  With these changes, you may see your results before Dr. Assunta Curtis does. Please allow up to 72 hours for review and response to your results.     -- If you are having acute (new/sudden onset) or severe/worsening neurologic symptoms, please call 911 or seek care in ED.    -- For scheduling of IMAGING/RADIOLOGY, please call [phone] at your convenience to schedule your studies.  -- For referrals placed during the visit, if you have not heard from scheduling within one week, please call the call center at [phone] to get scheduling assistance.  -- For refills on medications, please first contact your pharmacy, who will fax a refill authorization request form to our office.  Weekdays only. Allow up to 2 business days for refills. Please plan ahead, as refills will not be filled after hours.  -- Our scheduling staff, Mardella Layman, may be contacted [phone] for scheduling needs.   -- Gerri Lins, RN, may be contacted at [phone] for urgent needs. Staff will return your call within 24 business hours.     For Appointments:   -- Please try to arrive early for your appointment time to help facilitate your visit. 15 minutes early is recommended.   -- If you are late to your appointment, we reserve the right to ask you to reschedule or wait until next available time to be seen in fairness to other patients scheduled that day.   -- There are times when we are running behind in clinic. Our goal is to always be on time, however, there are time when unexpected events occur with patients, which may cause a delay. We appreciate your understanding when this occurs.

## 2022-01-08 ENCOUNTER — Encounter: Admit: 2022-01-08 | Discharge: 2022-01-08 | Payer: MEDICARE

## 2022-01-19 ENCOUNTER — Encounter: Admit: 2022-01-19 | Discharge: 2022-01-19 | Payer: MEDICARE

## 2022-02-01 ENCOUNTER — Ambulatory Visit: Admit: 2022-02-01 | Discharge: 2022-02-01 | Payer: MEDICARE

## 2022-02-01 ENCOUNTER — Encounter: Admit: 2022-02-01 | Discharge: 2022-02-01 | Payer: MEDICARE

## 2022-02-01 DIAGNOSIS — G4730 Sleep apnea, unspecified: Secondary | ICD-10-CM

## 2022-02-01 DIAGNOSIS — G629 Polyneuropathy, unspecified: Secondary | ICD-10-CM

## 2022-02-01 DIAGNOSIS — I519 Heart disease, unspecified: Secondary | ICD-10-CM

## 2022-02-01 DIAGNOSIS — J45909 Unspecified asthma, uncomplicated: Secondary | ICD-10-CM

## 2022-02-01 DIAGNOSIS — Z9989 Dependence on other enabling machines and devices: Secondary | ICD-10-CM

## 2022-02-01 DIAGNOSIS — A6920 Lyme disease, unspecified: Secondary | ICD-10-CM

## 2022-02-01 DIAGNOSIS — M1990 Unspecified osteoarthritis, unspecified site: Secondary | ICD-10-CM

## 2022-02-01 DIAGNOSIS — E785 Hyperlipidemia, unspecified: Secondary | ICD-10-CM

## 2022-02-01 DIAGNOSIS — H409 Unspecified glaucoma: Secondary | ICD-10-CM

## 2022-02-01 DIAGNOSIS — I10 Essential (primary) hypertension: Secondary | ICD-10-CM

## 2022-02-01 DIAGNOSIS — Z9109 Other allergy status, other than to drugs and biological substances: Secondary | ICD-10-CM

## 2022-02-01 DIAGNOSIS — Z95828 Presence of other vascular implants and grafts: Secondary | ICD-10-CM

## 2022-02-01 LAB — MPO/PR3 W REFLEX TO ANCA

## 2022-02-01 LAB — SED RATE: ESR: 9 mm/h (ref 0–20)

## 2022-02-01 LAB — C REACTIVE PROTEIN (CRP): C-REACTIVE PROTEIN: 0.2 mg/dL (ref <1.0)

## 2022-02-01 NOTE — Patient Instructions
.  --   Preferred method of communication is through [employer], if the issue cannot wait until your next scheduled follow up.   -- MyChart may be used for non-emergent communication. MyChart messages are not reviewed after hours or over the weekend/holidays/after 4PM. Staff will reply to your email within 24-48 business hours.       -- If you do not hear from [place] within one week of a lab or imaging study being completed, please call/send MyChart message to the office to be sure that we have received the results. This is especially challenging when tests are done outside of the [facility] system, as many times results do not make it back to our office for a variety of reasons. In our office no news is good news does not apply. You should hear from [place] with results for each test.  [facility] lab/imaging results:  Due to the CARES act, results automatically release to MyChart.  Dr. Assunta Curtis will continue to send you a result note on any labs that he orders.  With these changes, you may see your results before Dr. Assunta Curtis does. Please allow up to 72 hours for review and response to your results.     -- If you are having acute (new/sudden onset) or severe/worsening neurologic symptoms, please call 911 or seek care in ED.    -- For scheduling of IMAGING/RADIOLOGY, please call [phone] at your convenience to schedule your studies.  -- For referrals placed during the visit, if you have not heard from scheduling within one week, please call the call center at [phone] to get scheduling assistance.  -- For refills on medications, please first contact your pharmacy, who will fax a refill authorization request form to our office.  Weekdays only. Allow up to 2 business days for refills. Please plan ahead, as refills will not be filled after hours.  -- Our scheduling staff, Mardella Layman, may be contacted [phone] for scheduling needs.   -- Gerri Lins, RN, may be contacted at [phone] for urgent needs. Staff will return your call within 24 business hours.     For Appointments:   -- Please try to arrive early for your appointment time to help facilitate your visit. 15 minutes early is recommended.   -- If you are late to your appointment, we reserve the right to ask you to reschedule or wait until next available time to be seen in fairness to other patients scheduled that day.   -- There are times when we are running behind in clinic. Our goal is to always be on time, however, there are time when unexpected events occur with patients, which may cause a delay. We appreciate your understanding when this occurs.

## 2022-02-01 NOTE — Progress Notes
Dr. Vinson Moselle ordered [employer] Comprehensive Neuropathies Panel for patient. Patient identifiers were applied to vial and given to phlebotomist for collection of blood. Vial was put in biohazard bag then inside of blood collection kit. This was put inside of [employer] packaging and placed at the front of [facility] for courier pick up.

## 2022-02-08 ENCOUNTER — Encounter: Admit: 2022-02-08 | Discharge: 2022-02-08 | Payer: MEDICARE

## 2022-02-16 ENCOUNTER — Encounter: Admit: 2022-02-16 | Discharge: 2022-02-16 | Payer: MEDICARE

## 2022-02-16 DIAGNOSIS — G629 Polyneuropathy, unspecified: Secondary | ICD-10-CM

## 2022-03-09 ENCOUNTER — Encounter: Admit: 2022-03-09 | Discharge: 2022-03-09 | Payer: MEDICARE

## 2022-03-09 NOTE — Patient Instructions
--   Preferred method of communication is through [employer], if the issue cannot wait until your next scheduled follow up.   -- MyChart may be used for non-emergent communication. MyChart messages are not reviewed after hours or over the weekend/holidays/after 4PM. Staff will reply to your email within 24-48 business hours.       -- If you do not hear from [place] within one week of a lab or imaging study being completed, please call/send MyChart message to the office to be sure that we have received the results. This is especially challenging when tests are done outside of the [facility] system, as many times results do not make it back to our office for a variety of reasons. In our office no news is good news does not apply. You should hear from [place] with results for each test.  [facility] lab/imaging results:  Due to the CARES act, results automatically release to MyChart.  Dr. Assunta Curtis will continue to send you a result note on any labs that he orders.  With these changes, you may see your results before Dr. Assunta Curtis does. Please allow up to 72 hours for review and response to your results.     -- If you are having acute (new/sudden onset) or severe/worsening neurologic symptoms, please call 911 or seek care in ED.    -- For scheduling of IMAGING/RADIOLOGY, please call [phone] at your convenience to schedule your studies.  -- For referrals placed during the visit, if you have not heard from scheduling within one week, please call the call center at [phone] to get scheduling assistance.  -- For refills on medications, please first contact your pharmacy, who will fax a refill authorization request form to our office.  Weekdays only. Allow up to 2 business days for refills. Please plan ahead, as refills will not be filled after hours.  -- Our scheduling staff, Mardella Layman, may be contacted [phone] for scheduling needs.   -- Gerri Lins, RN, may be contacted at [phone] for urgent needs. Staff will return your call within 24 business hours.     For Appointments:   -- Please try to arrive early for your appointment time to help facilitate your visit. 15 minutes early is recommended.   -- If you are late to your appointment, we reserve the right to ask you to reschedule or wait until next available time to be seen in fairness to other patients scheduled that day.   -- There are times when we are running behind in clinic. Our goal is to always be on time, however, there are time when unexpected events occur with patients, which may cause a delay. We appreciate your understanding when this occurs

## 2022-03-31 ENCOUNTER — Encounter: Admit: 2022-03-31 | Discharge: 2022-03-31 | Payer: MEDICARE

## 2022-03-31 ENCOUNTER — Ambulatory Visit: Admit: 2022-03-31 | Discharge: 2022-04-01 | Payer: MEDICARE

## 2022-03-31 DIAGNOSIS — Z9109 Other allergy status, other than to drugs and biological substances: Secondary | ICD-10-CM

## 2022-03-31 DIAGNOSIS — I519 Heart disease, unspecified: Secondary | ICD-10-CM

## 2022-03-31 DIAGNOSIS — Z9989 Dependence on other enabling machines and devices: Secondary | ICD-10-CM

## 2022-03-31 DIAGNOSIS — E785 Hyperlipidemia, unspecified: Secondary | ICD-10-CM

## 2022-03-31 DIAGNOSIS — I10 Essential (primary) hypertension: Secondary | ICD-10-CM

## 2022-03-31 DIAGNOSIS — J45909 Unspecified asthma, uncomplicated: Secondary | ICD-10-CM

## 2022-03-31 DIAGNOSIS — A6920 Lyme disease, unspecified: Secondary | ICD-10-CM

## 2022-03-31 DIAGNOSIS — H409 Unspecified glaucoma: Secondary | ICD-10-CM

## 2022-03-31 DIAGNOSIS — Z95828 Presence of other vascular implants and grafts: Secondary | ICD-10-CM

## 2022-03-31 DIAGNOSIS — G4730 Sleep apnea, unspecified: Secondary | ICD-10-CM

## 2022-03-31 DIAGNOSIS — M1990 Unspecified osteoarthritis, unspecified site: Secondary | ICD-10-CM

## 2022-04-01 ENCOUNTER — Encounter: Admit: 2022-04-01 | Discharge: 2022-04-01 | Payer: MEDICARE

## 2022-04-01 DIAGNOSIS — I10 Essential (primary) hypertension: Secondary | ICD-10-CM

## 2022-04-01 DIAGNOSIS — I519 Heart disease, unspecified: Secondary | ICD-10-CM

## 2022-04-01 DIAGNOSIS — Z9109 Other allergy status, other than to drugs and biological substances: Secondary | ICD-10-CM

## 2022-04-01 DIAGNOSIS — M1990 Unspecified osteoarthritis, unspecified site: Secondary | ICD-10-CM

## 2022-04-01 DIAGNOSIS — Z9989 Dependence on other enabling machines and devices: Secondary | ICD-10-CM

## 2022-04-01 DIAGNOSIS — Z95828 Presence of other vascular implants and grafts: Secondary | ICD-10-CM

## 2022-04-01 DIAGNOSIS — A6920 Lyme disease, unspecified: Secondary | ICD-10-CM

## 2022-04-01 DIAGNOSIS — J45909 Unspecified asthma, uncomplicated: Secondary | ICD-10-CM

## 2022-04-01 DIAGNOSIS — H409 Unspecified glaucoma: Secondary | ICD-10-CM

## 2022-04-01 DIAGNOSIS — G4730 Sleep apnea, unspecified: Secondary | ICD-10-CM

## 2022-04-01 DIAGNOSIS — E785 Hyperlipidemia, unspecified: Secondary | ICD-10-CM

## 2022-04-06 ENCOUNTER — Encounter: Admit: 2022-04-06 | Discharge: 2022-04-06 | Payer: MEDICARE

## 2022-04-06 NOTE — Progress Notes
Successfully faxed MRI Lower Extrem wo/w cont LT to [facility] [place], attention Radiology, per patient request.

## 2022-04-13 ENCOUNTER — Encounter: Admit: 2022-04-13 | Discharge: 2022-04-13 | Payer: MEDICARE

## 2022-04-13 NOTE — Telephone Encounter
Dwayne Walters from [facility] radiology in [place] wanting to verify what the doctor is looking for since the order is for an MRI lower extremity. Patient is stating that they are supposed to do a femur, but the reason for the exam is for sciatic pain. She requested a call back to [phone].    Called Dwayne Walters back and discussed the order. Let her know Dr. Assunta Curtis is the one who put the order in and RN suggested she just do the lower extremity. [name] said the sciatic nerve is in the hip so this MRI Dr. Assunta Curtis ordered would not show that. Explained Dr. Assunta Curtis is up at the hospital doing consultations this week so it would be harder to get a hold of him. She said she was just going to do his hip, upper extremity, and lower extremity. RN stated understanding.

## 2022-05-03 ENCOUNTER — Encounter: Admit: 2022-05-03 | Discharge: 2022-05-03 | Payer: MEDICARE

## 2022-05-04 ENCOUNTER — Encounter: Admit: 2022-05-04 | Discharge: 2022-05-04 | Payer: MEDICARE

## 2022-05-04 DIAGNOSIS — R29898 Other symptoms and signs involving the musculoskeletal system: Secondary | ICD-10-CM

## 2022-05-04 DIAGNOSIS — R531 Weakness: Secondary | ICD-10-CM

## 2022-05-04 DIAGNOSIS — G5702 Lesion of sciatic nerve, left lower limb: Secondary | ICD-10-CM

## 2022-08-03 ENCOUNTER — Encounter: Admit: 2022-08-03 | Discharge: 2022-08-03 | Payer: MEDICARE

## 2022-08-03 ENCOUNTER — Ambulatory Visit: Admit: 2022-08-03 | Discharge: 2022-08-04 | Payer: MEDICARE

## 2022-08-03 DIAGNOSIS — M1990 Unspecified osteoarthritis, unspecified site: Secondary | ICD-10-CM

## 2022-08-03 DIAGNOSIS — Z95828 Presence of other vascular implants and grafts: Secondary | ICD-10-CM

## 2022-08-03 DIAGNOSIS — Z9989 Dependence on other enabling machines and devices: Secondary | ICD-10-CM

## 2022-08-03 DIAGNOSIS — Z9109 Other allergy status, other than to drugs and biological substances: Secondary | ICD-10-CM

## 2022-08-03 DIAGNOSIS — E785 Hyperlipidemia, unspecified: Secondary | ICD-10-CM

## 2022-08-03 DIAGNOSIS — J45909 Unspecified asthma, uncomplicated: Secondary | ICD-10-CM

## 2022-08-03 DIAGNOSIS — H409 Unspecified glaucoma: Secondary | ICD-10-CM

## 2022-08-03 DIAGNOSIS — I519 Heart disease, unspecified: Secondary | ICD-10-CM

## 2022-08-03 DIAGNOSIS — G4730 Sleep apnea, unspecified: Secondary | ICD-10-CM

## 2022-08-03 DIAGNOSIS — I10 Essential (primary) hypertension: Secondary | ICD-10-CM

## 2022-08-03 DIAGNOSIS — A6920 Lyme disease, unspecified: Secondary | ICD-10-CM

## 2022-08-03 NOTE — Patient Instructions
.  --   Preferred method of communication is through MyChart message, if the issue cannot wait until your next scheduled follow up.   -- MyChart may be used for non-emergent communication. MyChart messages are not reviewed after hours or over the weekend/holidays/after 4PM. Staff will reply to your email within 24-48 business hours.       -- If you do not hear from us within one week of a lab or imaging study being completed, please call/send MyChart message to the office to be sure that we have received the results. This is especially challenging when tests are done outside of the [facility] system, as many times results do not make it back to our office for a variety of reasons. In our office no news is good news does not apply. You should hear from us with results for each test.  [place] lab/imaging results:  Due to the CARES act, results automatically release to MyChart.  Dr. Varon will continue to send you a result note on any labs that he orders.  With these changes, you may see your results before Dr. Varon does. Please allow up to 72 hours for review and response to your results.     -- If you are having acute (new/sudden onset) or severe/worsening neurologic symptoms, please call 911 or seek care in ED.    -- For scheduling of IMAGING/RADIOLOGY, please call [phone] at your convenience to schedule your studies.  -- For referrals placed during the visit, if you have not heard from scheduling within one week, please call the call center at [phone] to get scheduling assistance.  -- For refills on medications, please first contact your pharmacy, who will fax a refill authorization request form to our office.  Weekdays only. Allow up to 2 business days for refills. Please plan ahead, as refills will not be filled after hours.  -- Our scheduling staff, Lindsey, may be contacted [phone] for scheduling needs.   -- Jaylan Hinojosa, RN, may be contacted at [phone] for urgent needs. Staff will return your call within 24 business hours.     For Appointments:   -- Please try to arrive early for your appointment time to help facilitate your visit. 15 minutes early is recommended.   -- If you are late to your appointment, we reserve the right to ask you to reschedule or wait until next available time to be seen in fairness to other patients scheduled that day.   -- There are times when we are running behind in clinic. Our goal is to always be on time, however, there are time when unexpected events occur with patients, which may cause a delay. We appreciate your understanding when this occurs.

## 2022-08-04 DIAGNOSIS — M21372 Foot drop, left foot: Secondary | ICD-10-CM

## 2022-08-05 ENCOUNTER — Encounter: Admit: 2022-08-05 | Discharge: 2022-08-05 | Payer: MEDICARE

## 2022-08-27 ENCOUNTER — Encounter: Admit: 2022-08-27 | Discharge: 2022-08-27 | Payer: MEDICARE

## 2022-09-22 ENCOUNTER — Encounter: Admit: 2022-09-22 | Discharge: 2022-09-22 | Payer: MEDICARE

## 2022-09-30 ENCOUNTER — Encounter: Admit: 2022-09-30 | Discharge: 2022-09-30 | Payer: MEDICARE

## 2022-11-02 NOTE — Patient Instructions
.  --   Preferred method of communication is through MyChart message, if the issue cannot wait until your next scheduled follow up.   -- MyChart may be used for non-emergent communication. MyChart messages are not reviewed after hours or over the weekend/holidays/after 4PM. Staff will reply to your email within 24-48 business hours.       -- If you do not hear from us within one week of a lab or imaging study being completed, please call/send MyChart message to the office to be sure that we have received the results. This is especially challenging when tests are done outside of the [facility] system, as many times results do not make it back to our office for a variety of reasons. In our office no news is good news does not apply. You should hear from us with results for each test.  [place] lab/imaging results:  Due to the CARES act, results automatically release to MyChart.  Dr. Varon will continue to send you a result note on any labs that he orders.  With these changes, you may see your results before Dr. Varon does. Please allow up to 72 hours for review and response to your results.     -- If you are having acute (new/sudden onset) or severe/worsening neurologic symptoms, please call 911 or seek care in ED.    -- For scheduling of IMAGING/RADIOLOGY, please call [phone] at your convenience to schedule your studies.  -- For referrals placed during the visit, if you have not heard from scheduling within one week, please call the call center at [phone] to get scheduling assistance.  -- For refills on medications, please first contact your pharmacy, who will fax a refill authorization request form to our office.  Weekdays only. Allow up to 2 business days for refills. Please plan ahead, as refills will not be filled after hours.  -- Our scheduling staff, Lindsey, may be contacted [phone] for scheduling needs.   -- Ezzie Senat, RN, may be contacted at [phone] for urgent needs. Staff will return your call within 24 business hours.     For Appointments:   -- Please try to arrive early for your appointment time to help facilitate your visit. 15 minutes early is recommended.   -- If you are late to your appointment, we reserve the right to ask you to reschedule or wait until next available time to be seen in fairness to other patients scheduled that day.   -- There are times when we are running behind in clinic. Our goal is to always be on time, however, there are time when unexpected events occur with patients, which may cause a delay. We appreciate your understanding when this occurs.

## 2022-11-03 ENCOUNTER — Encounter: Admit: 2022-11-03 | Discharge: 2022-11-03 | Payer: MEDICARE

## 2022-11-03 ENCOUNTER — Ambulatory Visit: Admit: 2022-11-03 | Discharge: 2022-11-03 | Payer: MEDICARE

## 2022-11-03 DIAGNOSIS — G1221 Amyotrophic lateral sclerosis: Secondary | ICD-10-CM

## 2022-11-03 DIAGNOSIS — I10 Essential (primary) hypertension: Secondary | ICD-10-CM

## 2022-11-03 DIAGNOSIS — I519 Heart disease, unspecified: Secondary | ICD-10-CM

## 2022-11-03 DIAGNOSIS — Z9109 Other allergy status, other than to drugs and biological substances: Secondary | ICD-10-CM

## 2022-11-03 DIAGNOSIS — E785 Hyperlipidemia, unspecified: Secondary | ICD-10-CM

## 2022-11-03 DIAGNOSIS — M1990 Unspecified osteoarthritis, unspecified site: Secondary | ICD-10-CM

## 2022-11-03 DIAGNOSIS — J45909 Unspecified asthma, uncomplicated: Secondary | ICD-10-CM

## 2022-11-03 DIAGNOSIS — Z9989 Dependence on other enabling machines and devices: Secondary | ICD-10-CM

## 2022-11-03 DIAGNOSIS — G4730 Sleep apnea, unspecified: Secondary | ICD-10-CM

## 2022-11-03 DIAGNOSIS — A6920 Lyme disease, unspecified: Secondary | ICD-10-CM

## 2022-11-03 DIAGNOSIS — Z95828 Presence of other vascular implants and grafts: Secondary | ICD-10-CM

## 2022-11-03 DIAGNOSIS — H409 Unspecified glaucoma: Secondary | ICD-10-CM

## 2022-11-03 NOTE — Progress Notes
Dwayne Walters is a 77 y.o. male.    CC: follow up       History of Present Illness    He presents for follow up in regard to left leg weakness. We last met 07/2022, he was more recently seen at [facility] in May 2024. Has had some progression in symptoms since our last visit, worse left leg weakness, some changes in handwriting, and lower volume of speech.      His recent evaluation at [facility] felt his symptoms overall were most consistent with ALS. Noted similar exam findings there as we had seen, but found more diffuse denervation changes on the EMG which were felt consistent with ALS.     PFTs at [facility] showed reduced expiratory and inspiratory force with normal FVC.     EMG from [facility] 09/21/22 showed active denervation/chronic reinnervation in all muscles of the left leg as well as the T6 and T9 paraspinals and the left FDI. Proximal left arm and genioglossus were normal.    He continues to work with PT.        Past Medical History:   Diagnosis Date    Arthritis     Asthma     BiPAP (biphasic positive airway pressure) dependence     Environmental allergies     Glaucoma (increased eye pressure)     Heart problem     Hyperlipidemia     Hypertension     Lyme disease 2006    Presence of arterial stent     Sleep apnea     Use of cane as ambulatory aid      Surgical History:   Procedure Laterality Date    AAA STENT EXTENSION      CORONARY ANGIOPLASTY      GLAUCOMA SURGERY Bilateral     X3    HERNIA REPAIR       Social History     Socioeconomic History    Marital status: Married   Tobacco Use    Smoking status: Never     Passive exposure: Never    Smokeless tobacco: Never   Substance and Sexual Activity    Alcohol use: Never    Drug use: Never    Sexual activity: Yes     Partners: Female     Birth control/protection: None     Family History   Problem Relation Name Age of Onset    Cancer Mother      Cancer-Breast Mother      Cancer Father      Cancer-Prostate Father      Parkinson's  Maternal Grandmother         Review of Systems      Objective:          ADVAIR DISKUS 250-50 mcg/dose inhalation disk INHALE 1 PUFF BY MOUTH TWO TIMES A DAY (RINSE MOUTH & THROAT AFTERWARDS)    albuterol sulfate (PROAIR HFA) 90 mcg/actuation HFA aerosol inhaler INHALE 2 PUFFS BY MOUTH EVERY 6 HOURS AS NEEDED FOR SHORTNESS OF BREATH    fluorouraciL (EFUDEX) 5 % topical cream APPLY TO HANDS TWICE DAILY FOR 3 WEEKS AND APPLY TO FACE/EARS TWICE DAILY FOR 2 WEEKS    fluticasone propionate (FLONASE) 50 mcg/actuation nasal spray, suspension USE 2 SPRAY(S) IN EACH NOSTRIL EVERY 24 HOURS    guaiFENesin LA (MUCINEX) 600 mg tablet Take two tablets by mouth twice daily.    hydroCHLOROthiazide (HYDRODIURIL) 25 mg tablet Take one tablet by mouth every 24 hours.    losartan (COZAAR)  100 mg tablet TAKE 1 TABLET BY MOUTH EVERY 24 HOURS FOR 90 DAYS    potassium chloride (KLOR-CON 10) 10 mEq tablet TAKE 1 TABLET BY MOUTH EVERY 24 HOURS FOR 90 DAYS    rosuvastatin (CRESTOR) 5 mg tablet TAKE TABLET BY MOUTH THREE TIMES A WEEK AT BEDTIME    SPIRIVA WITH HANDIHALER 18 mcg inhalation capsules INHALE THE CONTENTS OF ONE CAPSULE ONCE DAILY BY MOUTH USING HANDIHALER - MAY INHALE TWICE SO THE ONE CAPSULE IS COMPLETELY EMPTY    timoloL maleate (TIMOPTIC) 0.5 % ophthalmic drops Place one drop into or around eye(s).     Vitals:    11/03/22 1314   BP: (!) 147/67   BP Source: Arm, Left Upper   Pulse: 89   Temp: 36.6 ?C (97.9 ?F)   Resp: 16   TempSrc: Temporal   PainSc: Zero   Weight: 95.2 kg (209 lb 14.4 oz)   Height: 177.8 cm (5' 10)     Body mass index is 30.12 kg/m?Marland Kitchen     Physical Exam    Neurological examination:   Mental Status: Alert, oriented  CN II thru [id number] extraocular movements     Palpebral Fissure 10/10   Orb Oculi 5   Orb Oris 5   Tongue  5, no atrophy/fasciculations      Speech: No Dysarthria  Motor Exam:     Neck flexors: 4   Neck extensors: 5        Right Left   Right Left   Shoulder abductors: 5 5 Hip flexion: 5 4- (variable activation)   Elbow flexors: 5 5 Hip abduction: 5 4   Elbow extensors: 5 5 Hip Adduction:     Wrist extensors:   Knee extension: 5 5   Wrist flexors:   Knee flexion: 5 4   Finger flexors:   Ankle dorsiflexion: 5 0   Finger extensors: 5 5 Ankle plantar flexion: 5 2   Finger abductors: 4+ 4 Ankle eversion: 5 0   Thumb abductors:   Ankle inversion: 5 0         Toe extension:           Toe flexion:        Sensory: Vibration 0s at L GT, present on R    Reflexes:   Reflex   Right   Left     Brachioradialis   3 3   Biceps    3 3   Triceps 2 2   Knee 3 3   Ankle   0 0        Right   Left     Plantar response     Hoffman?s Absent  Absent      Gait and Station: left steppage          Assessment and Plan:    Impression:  Progressive left leg weakness. Onset Jan 2023.   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 Diagnosis given at [facility] recently based on left leg weakness, elevated neurofilament light chain, and EMG which showed active denervation in the left lumbosacral and left thoracic myotomes. Bulbar muscles showed no active denervation and cervical myotomes only showed denervation distally in the FDI. On exam at this time, he only has mixed UMN/LMN features in the left leg. Atypical features regarding the diagnosis of ALS include the overall course and presence of only 1 clearly symptomatic limb. Has had some progression since we first  met July 2023, though symptoms are still largely confined to the left leg. The [facility] PFTs showed some impaired pressures indicating possible respiratory involvement from ALS, though at least at this time he has few respiratory symptoms.     Plan:  Discussed role of genetic testing. He plans to think about whether he wishes to pursue this.  Provided letter stating diagnosis in regard to recent request for jury duty.  Discussed risks/benefit of riluzole and radicava. He is not interested in pursuing medications.  Would continue use of walker for now.  Would suggest waiting until ALSA visit before proceeding with home stair lift.   Has upcoming appointment to establish with the multidisciplinary ALSA clinic.    Total Time Today was 40 minutes in the following activities: Preparing to see the patient, Obtaining and/or reviewing separately obtained history, Performing a medically appropriate examination and/or evaluation, Counseling and educating the patient/family/caregiver, Ordering medications, tests, or procedures and Documenting clinical information in the electronic or other health record.

## 2022-11-09 ENCOUNTER — Encounter: Admit: 2022-11-09 | Discharge: 2022-11-09 | Payer: MEDICARE

## 2022-12-18 ENCOUNTER — Encounter: Admit: 2022-12-18 | Discharge: 2022-12-18 | Payer: MEDICARE

## 2022-12-30 ENCOUNTER — Encounter: Admit: 2022-12-30 | Discharge: 2022-12-30 | Payer: MEDICARE

## 2022-12-30 NOTE — Progress Notes
SOCIAL WORK ALS BIOPSYCHOSOCIAL ASSESSMENT    Name:       Dwayne Walters                   Referral Reason: SW assessment social history                                  Source of Information:   Dwayne Walters, his wife    Email Address:       [email or web address]             PCP:  Dr. Thereasa Solo     PRESENT SITUATION              Sex:   M       Age:    77        Birthdate:    02/14/1946         Length of Time Patient has been aware of diagnosis:  Onset was in Jan 2023 with left drop foot and left L.E. weakness, possible dx of ALS in July 2023, confirmed diagnosis at [facility] in May 2024.       Statement of Presenting Primary Concern(s):  She wants to discuss a chair lift, stair lift, walker with seat, back brace due to lower back problems.      PATIENT'S LIVING SITUATION      List of household members: Lives with his wife      Home Lay-Out / Stairs: Lives in a split-level, a lot of stairs to navigate in the home.  Their bedroom is on the upper level, there is a bedroom in the basement.  From the garage no STE.    SUPPORTIVE RELATIONSHIPS & COMMUNITY RESOURCES     Marital Status:  Married      Children: 2 children, son in [place], and daughter in [place], who is an Chief Financial Officer Support:  Good support, they have nephews who lives close and are willing to help     Religion (optional):  Important, retired Geophysicist/field seismologist     FINANCIAL RESOURCES     Education:  Master's degree       Employment history/status:  Retired [employer], in 2007.  He was bitten by a tick at that time, he started to have health concerns with heart and general fatigue.  His nervous system was impacted, he couldn't tolerate auditory stimulation, he had a hard time recalling names, which was unlike him, all of which led to his retirement.     Short Term Disability, Social Security Disability and FMLA:  SSA and Medicare in place        Medication affordability: No concerns    COGNITIVE ABILITIES, MENTAL HEALTH & SUBSTANCE ABUSE     Mental Health: He doesn't have mental health concerns, but he does get frustrated with things he can no longer do.       Substance Abuse History: None               Mental Status Changes: No cognitive changes    MEDICAL     DPOA/Advanced Directive:  DPOA and Advanced Directive, will try to bring     Current ADL/Mobility Status: He is independent with ADLs/IADLs, but slower, he has difficulty with mobility, using a walker.  She feels he is having lower back problems due to the way he is walking to compensate for the impairment in the left  LE.       Fall hx:  No falls     Transportation:    He rides around on his riding Surveyor, mining to do yard work, and get around the property.  He can still drive the car, his left LE is most impacted, right is not.       DME have/need:  walker, cane, walking sticks, bath bench, LH shower head, grab bar in shower     Home Services:   Nephew does mowing, lawn, weed eating and garden                Medication compliance/management:   Independent with finances and medications       Sleep:   He sleeps well overall       Appetite/Weight: His appetite isn't as good as it used to be.  He doesn't cough/choke with meals.  His weight is stable.        Breathing:  He uses a bipap.  His 02 was 93%, Dwayne Walters is having him use a respirator to exercise his breathing muscles.      Other: He always feels congested.  He has a history of asthma and allergies.    Social Work Impression/Recommendation: Dwayne Walters is a well supported gentleman who is interested in guidance about how to manage disease progression and education on equipment.  He remains independent with ADLs/IADLs and is still driving.  He has some breathing changes, which Dwayne Walters feels are mostly related to his asthma and allergies which he will benefit from addressing.        FOLLOW-UP: Will follow-up on 8/19 at the ALS clinic.

## 2023-01-03 ENCOUNTER — Encounter: Admit: 2023-01-03 | Discharge: 2023-01-03 | Payer: MEDICARE

## 2023-01-03 ENCOUNTER — Ambulatory Visit: Admit: 2023-01-03 | Discharge: 2023-01-03 | Payer: MEDICARE

## 2023-01-03 VITALS — BP 153/71 | HR 84 | Temp 99.50000°F | Resp 16 | Ht 70.0 in | Wt 209.0 lb

## 2023-01-03 DIAGNOSIS — Z9989 Dependence on other enabling machines and devices: Secondary | ICD-10-CM

## 2023-01-03 DIAGNOSIS — Z95828 Presence of other vascular implants and grafts: Secondary | ICD-10-CM

## 2023-01-03 DIAGNOSIS — H409 Unspecified glaucoma: Secondary | ICD-10-CM

## 2023-01-03 DIAGNOSIS — I10 Essential (primary) hypertension: Secondary | ICD-10-CM

## 2023-01-03 DIAGNOSIS — I519 Heart disease, unspecified: Secondary | ICD-10-CM

## 2023-01-03 DIAGNOSIS — A6920 Lyme disease, unspecified: Secondary | ICD-10-CM

## 2023-01-03 DIAGNOSIS — E785 Hyperlipidemia, unspecified: Secondary | ICD-10-CM

## 2023-01-03 DIAGNOSIS — G4730 Sleep apnea, unspecified: Secondary | ICD-10-CM

## 2023-01-03 DIAGNOSIS — M1990 Unspecified osteoarthritis, unspecified site: Secondary | ICD-10-CM

## 2023-01-03 DIAGNOSIS — Z9109 Other allergy status, other than to drugs and biological substances: Secondary | ICD-10-CM

## 2023-01-03 DIAGNOSIS — J45909 Unspecified asthma, uncomplicated: Secondary | ICD-10-CM

## 2023-01-03 NOTE — Progress Notes
Respiratory Therapy Assessment    NAME:Dwayne Walters             MRN: [id number]             DOB:05-24-1945          AGE: 77 y.o.    FVC and NIF/MIP    FVC: 2.93L 76% Mild Restrictive Abnormality  NIF: - 6 cmH2O    Subjective:  Dwayne Walters has been wearing a BIPAP for about 10 years IPAP 18 EPAP 14. He is experiencing episodes of SOA in middle of night despite BIPAP use and is reporting headaches. Hypercapnia suspected. Dwayne Walters is a side sleeper and props himself up with 2 pillows.  No problems expelling secretions.     Recommend noninvasive volume ventilator for progressing chest wall weakness, increased respiratory frequency, and ventilatory failure evidenced by accessory muscle use, and c/o SOB.  Bipap not indicated secondary to the requirement for volume ventilator due to increasing pressure requirements with progressive reduction in chest wall compliance and the need for ventilator available 24 hours a day and risk of life threatening hypoventilation.      No further respiratory questions or concerns.

## 2023-01-03 NOTE — Progress Notes
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 Introductory visit completed as patient is new to clinic.     Home set-up: Lives with wife in split-level home. They have no stairs to enter the home from the garage. Once inside, there are 15 total stairs to reach the main level of their home, divided into 3 separate sections: stairwell of 8 steps to front entryway, corner then 4 steps to living room, partial corner then 3 steps to primary level with kitchen, bedroom, and bathroom. There is a bedroom and full bathroom on the lower level of their home as well. Patient manages steps using hand railing and cane.    Work: Retired [employer] of 40 years.     IADLs: Patient is independent with fiances and managing medications. He is able to mow his yard using a riding lawnmower. Patient voices concern with truncal fatigue while cooking and doing dishes, but he is able to complete if leaning against counter and with breaks as needed. Patient notes he has stopped vacuuming because he does not have adequate stability to complete task safely. Wife assists with all other household IADLs.        Driving: Patient is still driving with no concerns. He reports that he is experiencing no difficulty with his RLE but will stop driving if it becomes difficult.     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 Patient has been attending OP PT for 20 weeks, previously 2x/week but now 1x/week.    Technology use: Patient reports that he does not often use technology. He is not experiencing any difficulty managing his land line.     Sex/intimacy: Patient reports no concerns.    Sleep/rest: Patient is experiencing some difficulty with sleep, however attributes this to his bipap. He states that he will be receiving a new machine soon and believes it will help.      ADL STATUS      Feeding: Independent   Equipment/modifications: Patient is using regular utensils, reports no difficulty in grasping utensils.     Grooming: Modified Independent   Equipment/modifications: Patient completes grooming tasks leaning against counter as he fatigues easily.      UB/LB Dressing: Modified Independent   Equipment/modifications: Performs most UB/LB dressing seated, either on arm of davenport or seat of 4WW, but manages footwear while seated on steps for lower seat height and less reaching. No difficulty managing fasteners.     Toileting: Modified Independent   Equipment/modifications: Raised toilet seat, utilizes nearby sink for UE support as needed.    Bathing: Modified Independent   Equipment/modifications: Patient soaks in whirlpool spa tub due to truncal fatigue when standing in shower. Patient reports no difficulty transferring into and out of the tub. States to transfer out of the tub he moves to his knee and pushes on tub wall with upper extremities. Additionally, he has a walk-in shower with a built-in corner seat, handheld shower head, and 3 suction grab bars inside, plus 1 permanent grab bar outside of the shower and within reach of the tub. Tub/shower combos also available on upper and lower levels of the home.     MOBILITY     Ambulation: Uses 4WW for ambulation in  the home, which is kept on the home's upper level. In the community, patient utilizes 2WW which he is able to transfer into his vehicle independently.     Wheelchair: N/A     Transfers: Patient performs sit-to-stand transfers from chair with use of arm rests with no difficulty.     Falls: Reports no falls.     UE STATUS   Starting with arms at sides, abducts the arms in a full circle until they touch above the head.     MVMT AROM - R AROM - L Strength - R Strength - L   Shoulder flex WNL WNL 4+ 4   Shoulder ext WNL WNL 4+ 4+ Shoulder abd WNL WNL 4 4-   Shoulder add WNL WNL 5 5   Elbow flex WNL WNL 5 5   Elbow ext WNL WNL 5 5   Gross grip WNL WNL 4+ 4   Lateral pinch WNL WNL 4- 4-   2-pt pinch WNL WNL 4 4-   3-pt pinch WNL WNL 4 4       Communication: Able to communicate wants and needs independently.     A/P: Introduced role of occupational therapy in multi-disciplinary clinic. Patient reports ability to complete ADLs and IADLs with increased time. He expressed increased weakness in bilateral index fingers but does not believe this is impacting his ability to perform daily occupations. Recommended the following equipment for future use, especially if hand weakness progresses:  Foam handles - for ease of holding eating utensils, toothbrush, razor, writing utensils, etc.  Dycem - to help with opening lids/jars, can also be used to help transport items on 4WW seat to different areas without sliding off surface.  Wide-handled silverware - for easier grasp on eating utensils.  Pen Again - modified pen to support change of hand function and increase ease of holding writing utensil.  ALSA to mail the above items to patient's home.    Introduced future modifications at toilet and shower for patient to consider as disease progresses. For toilet, recommend sturdiest option of installed grab bars if studs are available nearby and introduced toilet safety frame that can be added to assist with transferring on and off the toilet. Educated patient and family on use of bidet for ease of hygiene after toileting and discussed variety of bidets available. For shower, introduced zero-entry shower as best option if wanting to renovate in the future. Discussed the importance of no built-in bench to support future needs of patient, as most appropriate shower chair for patient will likely change as disease progresses. Educated patient on various shower seats that are compatible with tub/shower combo if planning to keep tub in place, and this included tub transfer bench (with additional features of commode opening, sliding rails, and swivel seat) and complex seating system such as Furniture conservator/restorer (with features of tilt, foot rests, arm rests, wheels, etc). After visit, OT discussed with ALSA navigator that patient and family would benefit from free virtual home assessment to review options for home accessibility in the future. ALSA navigator to arrange with patient.    Educated patient on energy conservation while cooking, and recommend bar stool height chair with back rest and arm rests for support. Educated patient on testing out work stool that he already owns in the kitchen prior to obtaining more supportive stool, to ensure proper height and to ensure safety while transferring on and off the stool.     Patient with no further concerns. OT will continue to  follow.    Therapist: Alycia Patten, OT; Ezzard Standing, OTS  Date: 01/03/2023

## 2023-01-03 NOTE — Progress Notes
SW followed up with Dwayne Walters at his ALS visit, his wife, [name], and daughter, [name].    They were provided education on the role of SW on the multidisciplinary team.  At this time they were looking for feedback on how to pursue a lift chair, and portable PWC, with [employer] listed as an option.  A link will be sent to them for this by email.  Also they were interested in pursuing how to move forward with looking at stair lift companies.  Information will be emailed to them as well to [email or web address], and [email or web address].      They were provided a SW business card and encouraged to reach out as needed as needs arise.

## 2023-01-03 NOTE — Progress Notes
Dwayne Walters is a 77 y.o. male.    CC: follow up     History of Present Illness    The patient is accompanied by his wife and daughter. They report that they are doing well.     UEXT: Starting to notice weakness of the right hand with certain tasks such as combing his hair, eating, and writing.     LEXT: Reports he has not noticed much change in the strength of his right leg, but is now nearly unable to navigate stairs, especially if he is fatigued. This is a significant impairment as the living areas in his home are on the second floor.     The patient reports that his lower back no longer supports him. He reports that he is having difficulty standing up straight and has to brace himself on to countertops while standing. The patient reports he has done 20 weeks of therapy and is interested in restarting PT with [facility].     Cramps: Denies     Speech/swallow: Reports hypophonia early in the morning that improves throughout the day. Denies difficulty with swallowing.     PBA:  Denies     Breathing: Reports waking up in the middle of the night with a headache and struggling to breath which has worsened his fatigue. He denies issues during the day.     Meds/equipment: Interested in a new BiPAP machine. Patient is still not interested in medications.       Past Medical History:   Diagnosis Date    Arthritis     Asthma     BiPAP (biphasic positive airway pressure) dependence     Environmental allergies     Glaucoma (increased eye pressure)     Heart problem     Hyperlipidemia     Hypertension     Lyme disease 2006    Presence of arterial stent     Sleep apnea     Use of cane as ambulatory aid      Surgical History:   Procedure Laterality Date    AAA STENT EXTENSION      CORONARY ANGIOPLASTY      GLAUCOMA SURGERY Bilateral     X3    HERNIA REPAIR       Social History     Socioeconomic History    Marital status: Married   Tobacco Use    Smoking status: Never     Passive exposure: Never    Smokeless tobacco: Never   Substance and Sexual Activity    Alcohol use: Never    Drug use: Never    Sexual activity: Yes     Partners: Female     Birth control/protection: None     Family History   Problem Relation Name Age of Onset    Cancer Mother      Cancer-Breast Mother      Cancer Father      Cancer-Prostate Father      Parkinson's  Maternal Grandmother         Review of Systems      Objective:          ADVAIR DISKUS 250-50 mcg/dose inhalation disk INHALE 1 PUFF BY MOUTH TWO TIMES A DAY (RINSE MOUTH & THROAT AFTERWARDS)    albuterol sulfate (PROAIR HFA) 90 mcg/actuation HFA aerosol inhaler INHALE 2 PUFFS BY MOUTH EVERY 6 HOURS AS NEEDED FOR SHORTNESS OF BREATH    cefdinir (OMNICEF) 300 mg capsule Take one capsule by mouth every 12  hours. For 7 days    fluorouraciL (EFUDEX) 5 % topical cream APPLY TO HANDS TWICE DAILY FOR 3 WEEKS AND APPLY TO FACE/EARS TWICE DAILY FOR 2 WEEKS    fluticasone propionate (FLONASE) 50 mcg/actuation nasal spray, suspension USE 2 SPRAY(S) IN EACH NOSTRIL EVERY 24 HOURS    guaiFENesin LA (MUCINEX) 600 mg tablet Take two tablets by mouth twice daily.    hydroCHLOROthiazide (HYDRODIURIL) 25 mg tablet Take one tablet by mouth every 24 hours.    losartan (COZAAR) 100 mg tablet TAKE 1 TABLET BY MOUTH EVERY 24 HOURS FOR 90 DAYS    methylPREDNISolone (MEDROL) 4 mg tablet Take one tablet by mouth every morning. Take with food.    potassium chloride (KLOR-CON 10) 10 mEq tablet TAKE 1 TABLET BY MOUTH EVERY 24 HOURS FOR 90 DAYS    rosuvastatin (CRESTOR) 5 mg tablet TAKE TABLET BY MOUTH THREE TIMES A WEEK AT BEDTIME    SPIRIVA WITH HANDIHALER 18 mcg inhalation capsules INHALE THE CONTENTS OF ONE CAPSULE ONCE DAILY BY MOUTH USING HANDIHALER - MAY INHALE TWICE SO THE ONE CAPSULE IS COMPLETELY EMPTY    timoloL maleate (TIMOPTIC) 0.5 % ophthalmic drops Place one drop into or around eye(s).     Vitals:    01/03/23 0740   BP: (!) 153/71   BP Source: Arm, Left Upper   Pulse: 84   Temp: 37.5 ?C (99.5 ?F)   Resp: 16   SpO2: 95%   TempSrc: Temporal   PainSc: Zero   Weight: 94.8 kg (209 lb)   Height: 177.8 cm (5' 10)     Body mass index is 29.99 kg/m?Marland Kitchen     Physical Exam    Neurological examination:   Mental Status: Alert, oriented  CN II thru UXL:KGMWNU extraocular movements     Palpebral Fissure 10/10   Orb Oculi 5   Orb Oris 5   Tongue  5, no atrophy/fasciculations      Speech: No Dysarthria  Motor Exam:     Neck flexors: 4   Neck extensors: 5        Right Left   Right Left   Shoulder abductors: 5 5 Hip flexion: 5 4- (variable activation)   Elbow flexors: 5 5 Hip abduction: 5 4   Elbow extensors: 5 5 Hip Adduction:     Wrist extensors:   Knee extension: 5 5   Wrist flexors:   Knee flexion: 5 4   Finger flexors:   Ankle dorsiflexion: 5 0   Finger extensors: 5 5 Ankle plantar flexion: 5 2   Finger abductors: 4+ 4 Ankle eversion: 5 0   Thumb abductors:   Ankle inversion: 5 0         Toe extension:           Toe flexion:        Sensory: Vibration 0s at L GT, present on R    Reflexes:   Reflex   Right   Left     Brachioradialis   3 3   Biceps    3 3   Triceps 2 2   Knee 3 3   Ankle   0 0        Right   Left     Plantar response     Hoffman?s Absent  Absent      Gait and Station: left steppage          Assessment and Plan:    Impression:  Progressive left leg weakness. Onset  Jan 2023.   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 Diagnosis given at [facility] recently based on left leg weakness, elevated neurofilament light chain, and EMG which showed active denervation in the left lumbosacral and left thoracic myotomes. Bulbar muscles showed no active denervation and cervical myotomes only showed denervation distally in the FDI. On exam at this time, he only has mixed UMN/LMN features in the left leg. Atypical features regarding the diagnosis of ALS include the overall course and presence of only 1 clearly symptomatic limb. Has had some progression since we first met July 2023, though symptoms are still largely confined to the left leg. The [facility] PFTs showed some impaired pressures indicating possible respiratory involvement from ALS, though at least at this time he has few respiratory symptoms.     Plan:  Not interested in ALS medications such as riluzole and radicava.   Have discussed, but have not completed genetic testing. Previously wished to think about pursuing this.  Would suggest waiting until ALSA visit before proceeding with home stair lift.   Has upcoming appointment to establish with the multidisciplinary ALSA clinic.    ATTESTATION    I personally performed the key portions of the E/M visit, discussed case with resident and concur with resident documentation of history, physical exam, assessment, and treatment plan unless otherwise noted.    Suspected ALS. Progressive leg weakness. Diagnosed as ALS by [facility]. Had denervation/reinnervation changes in the left leg which are consistent wit ALS. Additional denervation seen at the FDI and thoracic paraspinals as well as low NIF/VS were felt to demonstrate diffuse involvement consistent wit ALS. Some progression since our last visit with interval hand weakness. Has functionally limiting weakness affecting leg function, respiratory function, and early non functionally limiting weakness in the arms.     Plan:  See ALSA team members.   Needs stairlift in the home due to progressive leg weakness.  Referral to PT.  Not interested in ALS medications.   Follow up in about 3 months.    Staff name:  Romana Juniper, MD Date: 01/05/2023     Total Time Today was 40 minutes in the following activities: Preparing to see the patient, Obtaining and/or reviewing separately obtained history, Performing a medically appropriate examination and/or evaluation, Counseling and educating the patient/family/caregiver, Ordering medications, tests, or procedures and Documenting clinical information in the electronic or other health record.

## 2023-01-03 NOTE — Progress Notes
SPEECH PATHOLOGY ENCOUNTER  DEPARTMENT OF NEUROLOGY    NAME: Dwayne Walters    DOB: 1945-11-16   MRN: [id number]  DATE: 01/03/2023   CLINIC: ALS (Amyotrophic Lateral Sclerosis) Clinic    Time in: 11:05 a.m.  Time out: 11:35 a.m.    CHIEF COMPLAINT/RELEVANT HISTORY: ALS Limb onset (onset 05/2021)    History provided by: patient, daughter, and wife    Speech: Pt reports no recent changes to his speech or communication skills. He demonstrates appropriate expressive/receptive language.  Pt notices early morning problems with voice (sounding hoarse), which may be bipap related.      Swallow: Pt denies changes to swallow function. He maintains a regular diet, drinks thin liquids and takes pills easily. He denies excess saliva or dry mouth symptoms. His weight is stable.      EXAM FINDINGS    Speech:          Current method of communication:   verbal    Speech is characterized by:  Impaired voice quality: hoarse  % intelligibility estimate: 100%  Speech deficit rating  9 (10=normal, 1=nonvocal)    Situations when speech is difficult to understand:   Early morning    Patient report of others having difficulty hearing them:   No    Hearing:      Hearing functional for participation in this interaction without support? Yes    Hearing aides or support in place or used? No    Oral Mech Exam      Lips/Face:   At Rest: symmetrical  Smile: symmetrical, Adequate ROM  Pucker: symmetrical, Adequate ROM  Smile/Pucker AMRS: coordinated, adequate ROM  Impound Air: adequate  Dentition: natural-good    Jaw:   symmetrical, adequate ROM, adequate strength    Tongue:  At Rest: WNL  Protrusion: midline, adequate ROM  Lateralization: coordinated, adequate ROM  Elevation: WFL  Depression: WFL  Lateral Strength: WFL    Velopharynx:  Phonation: symmetrical    Swallowing     Eating Assessment Tool (EAT-10) (Belafsky et al., 2008)   A patient reported outcome measure of the patient's comfort and perception of difficulty in swallowing.   Patients are asked to score each statement on a scale of 0-4 where 0= no problem, and 4= a severe problem.   A score >3 may indicate a swallowing problem.     Question Score   My swallowing problem has caused me to lose weight.  0   My swallowing problem interferes with my ability to go out for meals. 0   Swallowing liquids takes extra effort.  0   Swallowing solids takes extra effort. 0   Swallowing pills takes extra effort. 0   Swallowing is painful. 0   The pleasure of eating is affected by my swallowing. 0   When I swallow food sticks in my throat. 0   I cough when I eat. 0   Swallowing is stressful 0                                                                                             TOTAL SCORE: 0   Normal  M= 0.4, SD 1.01 (Belafsky et al., 2008)     Current method of intake:   Oral    Forced Vital Capacity: See RT Note    Current Diet: Regular, Thin Liquids    Functional Oral Intake Scale (FOIS) (Crary, Mann, & Groher, 2005): FOIS Level 7 Total oral intake with no restrictions  A measure of a patient's of food and liquid intake. The level selected indicates foods or liquids the patient consumes for maintaining nutrition.   For levels 1-3 the patient is dependent on alternative/non-oral means of nutrition, for levels 4-7 only solids are considered (not liquids).    Body Weight Status:   stable    Coughing/Choking:   no choking reported, no coughing reported    Other Difficulties Noted:   none noted    CLINICAL IMPRESSIONS    Pt presents with minimal dysphonia  characterized by  Hoarse (wet) voice, No notable resonance features, No notable articulatory errors, No notable changes to pitch.    Pt presents with no dysphagia.    PLAN OF CARE    Speech  Continue to follow through [facility]  Voice banking  Voice/Message banking through [employer]: [email or web address]    Hearing  No recommendations    Swallowing  Diet regular diet and thin liquids, good oral hygiene, and continue to follow through ALSA clinic    Therapist: Rosita Fire Dantae Meunier  Date: 01/03/2023

## 2023-01-04 ENCOUNTER — Encounter: Admit: 2023-01-04 | Discharge: 2023-01-04 | Payer: MEDICARE

## 2023-01-04 DIAGNOSIS — G1221 Amyotrophic lateral sclerosis: Principal | ICD-10-CM

## 2023-01-04 MED ORDER — MISCELLANEOUS MEDICAL SUPPLY MISC MISC
0 refills | 1.00000 days | Status: AC
Start: 2023-01-04 — End: ?

## 2023-01-04 NOTE — Progress Notes
NIV order handed to [facility] @ [facility]

## 2023-01-04 NOTE — Progress Notes
PHYSICAL THERAPY ENCOUNTER  DEPARTMENT OF NEUROLOGY    NAME: Dwayne Walters DOB: 09/04/1945 MRN: [id number]  DATE: 01/03/2023  SUBJECTIVE: Patient presents for first visit to clinic today. His spouse and daughter are present.  Daughter is an OT and intends to assist family with necessaries per clinic recommendation.  Patient lives in a split level home.  There are no stairs to enter basement from the garage, and basement equipped with bedroom and bath with tub/shower combination.  7 stairs to enter from basement/garage to front door, with a corner and 3 stairs to living room.  There are then 2 stairs to top level of home, which includes kitchen, bathroom, bedrooms.  Stairs have sg HR.  Patient bathroom has a walk in shower with built in corner bench, combination suction cup and permanent grab bars.  There is also a whirlpool soaking tub, which he prefers to utilize.  Patient notes that he is able to get into a kneeling position and push up onto his feet after bathing.  Patient notes that he is unable to stand without 2 handed support and he walks with a 4WW inside home and 2WW outside of home.  ALSA rep has given him another 4WW today and he is happy to have this to replace the 2WW.  Patient notes fairly sig fatigue and frequently takes rest breaks sitting on 4WW.  Stairs are completed with great difficulty and he reports needing to complete stairs in morning, if he tries too late in the day, he is too fatigued to get up them safely.  He goes up and down stairs using HR and cane combination with a family member behind him in case he struggles.  Patient notes that outside of his home on his property he rides his riding lawn mower to access the property, including his large garden.  Additionally, patient was completing PT twice weekly for 30 min and has recently been discharged.  He began PT prior to receiving ALS diagnosis and would like to discuss resuming PT today.  Patient has questions about equipment and home modifications and has met with contractors to discuss stair lifts.     Primary Concern: Introductory visit; assess baseline measures.  Has equipment questions and would like outpatient PT    Falls since last visit: No  Current Equipment: 4WW (x 2), 2WW, SPC  Current Mobility: Mod-I to SBA    OBJECTIVE:  ROM/Tone: LUE globally 75-100% ROM; DF is absent    Strength    L R   L R   Hip flexion 3- 4  Ankle dorsi flexion 0 4   Knee extension 4- 4+  Hip adduction 3+ 4   Hip abduction 3+ 4          Transfers: Mod-I.  Ambulation: 50 ft, SBA with 4WW.  Patient ambulates with L-steppage, feet coming to midline, trunk hinge with fatigue, 4WW is slightly in front of torso, decreased L-heel strike  Gait/Balance Measures: Fair to Fair -  Ex Program: Has been discharged from outpatient PT.  Has an HEP and completes some stretching at home  Instructions: 1. Outpatient PT  2. Consider lightweight electric wheelchair--can self purchase and/or contact [employer].  3. Stair lift with belt for hips and chest.  4. Consider moving bedroom to basement and beginning renovations to basement bathroom    Resources:   [employer].  Equipment application is under Need Assistance? tab.  [email or web address]    ASSESSMENT: Patient is a 77 y/o who presents to clinic  with deficits to strength, ROM, balance, transfers, gait, functional mobility secondary to medical diagnosis ALS.  Introductory visit with baseline measures assessed today.  Patient diagnosis is unrelenting and progressive in nature.  Discussed safety and provided equipment options for mobility and household independence today.  Information provided on customized power wheelchair options and also on lightweight electric wheelchair options with recommendation to apply for lightweight electric chair through [employer].  Patient will benefit from a Lift Chair in order to promote safe, independent transfers in his home.  A lift chair additionally provides ability to extend his legs for change of position and contracture prevention.  Patient will benefit from a Stair Lift with ability to corner and with seatbelt and either 3-point harness or chest strap in order to promote independence in his home.  Mr. Hoganson home is a split level with bed, bath, kitchen access on top floor.  He requires assist for stairs and is at risk of a catastrophic fall on the stairs.  A stair lift will help him access all areas of his home safely.  Due to the progressive nature of his diagnosis, truncal weakness is expected.  A chest harness or 3-point harness will accommodate for this weakness and promote safe use of the stair lift.  Patient will additionally benefit from Outpatient physical therapy for stretching, balance, functional mobility, transfers, trunk control and posture, to provide HEP, and for fall prevention.  Clinic MD to provide orders for all. Patient is educated to findings, verbalizes understanding and agreement to all.     PLAN: Continue routine assessment in ALS clinic.    Therapist: Basilio Cairo, PT  Date: 01/03/2023

## 2023-01-05 ENCOUNTER — Encounter: Admit: 2023-01-05 | Discharge: 2023-01-05 | Payer: MEDICARE

## 2023-01-05 NOTE — Progress Notes
Lift chair and stair lift order mailed to patients house

## 2023-01-07 ENCOUNTER — Encounter: Admit: 2023-01-07 | Discharge: 2023-01-07 | Payer: MEDICARE

## 2023-01-07 NOTE — Progress Notes
PT referral successfully faxed to [facility] @ [phone]    The following job has been successfully delivered to the specified destinations or intermediate server.    ----- Original Job Details -----    Product Name: [name] Flow MFP [id number]    User: [employer]    Date: Aug/23/2024 9:33:44 AM (-0500 GMT)    Scanned Pages: 13    ----- Destination -----    [id number] Success    ----- Additional Details -----    Job     Date/Time                      Type  Line                           Identification  Duration  Pages  Result       [id number]  Aug/23/2024 9:15:00 AM         Send  Analog                         [id number]     18:27     13     Success

## 2023-01-24 ENCOUNTER — Encounter: Admit: 2023-01-24 | Discharge: 2023-01-24 | Payer: MEDICARE

## 2023-01-24 NOTE — Progress Notes
PT plan of care successfully faxed to [facility] @ [phone]    The following job has been successfully delivered to the specified destinations or intermediate server.    ----- Original Job Details -----    Product Name: [name] MFP [id number]    User: [employer]    Date: Sep/01/2023 12:46:04 PM (-0500 GMT)    Scanned Pages: 2    ----- Destination -----    [id number] Success    ----- Additional Details -----    Job     Date/Time                      Type  Line                           Identification  Duration  Pages  Result       [id number]  Sep/01/2023 12:44:19 PM         Send  Analog                         [id number]     01:37     2      Success

## 2023-02-03 ENCOUNTER — Encounter: Admit: 2023-02-03 | Discharge: 2023-02-03 | Payer: MEDICARE

## 2023-02-15 ENCOUNTER — Encounter: Admit: 2023-02-15 | Discharge: 2023-02-15 | Payer: MEDICARE

## 2023-02-15 NOTE — Progress Notes
Signed NIV order emailed to [facility] @ [facility]

## 2023-02-24 ENCOUNTER — Encounter: Admit: 2023-02-24 | Discharge: 2023-02-24 | Payer: MEDICARE

## 2023-02-24 MED ORDER — HYDROXYZINE HCL 25 MG PO TAB
25 mg | ORAL_TABLET | Freq: Every evening | ORAL | 3 refills | 30.00000 days | Status: AC
Start: 2023-02-24 — End: ?

## 2023-03-09 ENCOUNTER — Encounter: Admit: 2023-03-09 | Discharge: 2023-03-09 | Payer: MEDICARE

## 2023-03-09 NOTE — Progress Notes
Successfully faxed PT Outpatient Progress Note dated 02/21/23 signed and dated by Dr. Assunta Curtis to [facility]. Documentation sent to medical records.    From: [email or web address]   Sent: Tuesday, March 08, 2023 5:07 PM  To: Gerri Lins [email or web address]>  Subject: Job Notification: Success    The following job has been successfully delivered to the specified destinations or intermediate server.  ----- Original Job Details -----  Product Name: [name] MFP [id number]  User: Gerri Lins  Date: Oct/22/2024 5:06:41 PM (-0500 GMT)  Scanned Pages: 5  ----- Destination -----  [id number] Success  ----- Additional Details -----  Job    Date/Time                      Type  Line                           Identification  Duration  Pages  Result     [id number]  Oct/22/2024 5:03:27 PM         Send  Analog                         [id number]    03:04     5      Success

## 2023-04-04 ENCOUNTER — Encounter: Admit: 2023-04-04 | Discharge: 2023-04-04 | Payer: MEDICARE

## 2023-04-04 ENCOUNTER — Ambulatory Visit: Admit: 2023-04-04 | Discharge: 2023-04-04 | Payer: MEDICARE

## 2023-04-04 ENCOUNTER — Ambulatory Visit: Admit: 2023-04-04 | Discharge: 2023-04-05 | Payer: MEDICARE

## 2023-04-04 NOTE — Progress Notes
[facility] ALS [id number] Panel [id number] ordered by Dr. Assunta Curtis during office visit. Order placed through [employer], paper copy placed in box. Specimen tube labeled with pt identifiers. Patient given directions to the lab for phlebotomist to obtain blood specimen,  place in biohazard bag. Blood specimen then was placed in [facility] box, then in [employer] shipping bag that was supplied by [facility] and shipped out.

## 2023-04-04 NOTE — Progress Notes
Update on what [facility] has done with regards to NIV

## 2023-04-05 ENCOUNTER — Encounter: Admit: 2023-04-05 | Discharge: 2023-04-05 | Payer: MEDICARE

## 2023-04-05 NOTE — Progress Notes
NIV order emailed to [facility]

## 2023-04-13 ENCOUNTER — Encounter: Admit: 2023-04-13 | Discharge: 2023-04-13 | Payer: MEDICARE

## 2023-04-13 DIAGNOSIS — G1221 Amyotrophic lateral sclerosis: Secondary | ICD-10-CM

## 2023-05-27 ENCOUNTER — Encounter: Admit: 2023-05-27 | Discharge: 2023-05-27 | Payer: MEDICARE

## 2023-05-27 NOTE — Progress Notes
Successfully faxed PT Outpatient Progress Note signed and dated by Dr. Assunta Curtis to [facility]. Documentation sent to medical records.    From: [email or web address]   Sent: Friday, May 27, 2023 12:20 PM  To: Gerri Lins [email or web address]>  Subject: Job Notification: Success    The following job has been successfully delivered to the specified destinations or intermediate server.  ----- Original Job Details -----  Product Name: [name] MFP [id number]  User: Gerri Lins  Date: Jan/02/2024 12:19:38 PM (-0600 GMT)  Scanned Pages: 6  ----- Destination -----  [id number] Success  ----- Additional Details -----  Job     Date/Time                      Type  Line                           Identification  Duration  Pages  Result     [id number]  Jan/02/2024 12:16:23 PM        Send  Analog                         [id number]    03:08     6      Success

## 2023-05-31 ENCOUNTER — Encounter: Admit: 2023-05-31 | Discharge: 2023-05-31 | Payer: MEDICARE

## 2023-05-31 NOTE — Progress Notes
04/19/23 Successfully faxed PT Outpatient Progress Note dated 03/18/23 signed and dated by Dr. Assunta Curtis to [facility], attention: Rehab Dept. Documentation sent to medical records.    From: [email or web address]   Sent: Tuesday, April 19, 2023 12:41 PM  To: Gerri Lins [email or web address]>  Subject: Job Notification: Success    The following job has been successfully delivered to the specified destinations or intermediate server.  ----- Original Job Details -----  Product Name: [name] MFP [id number]  User: Gerri Lins  Date: Dec/07/2022 12:41:02 PM (-0600 GMT)  Scanned Pages: 6  ----- Destination -----  [id number] Success  ----- Additional Details -----  Job    Date/Time                      Type  Line                           Identification  Duration  Pages  Result     [id number]  Dec/07/2022 12:37:20 PM         Send  Analog                         [id number]    03:33     6      Success

## 2023-06-03 ENCOUNTER — Encounter: Admit: 2023-06-03 | Discharge: 2023-06-03 | Payer: MEDICARE

## 2023-06-03 NOTE — Progress Notes
Cough Assist notes successfully emailed to [facility] with [facility]

## 2023-07-04 ENCOUNTER — Encounter: Admit: 2023-07-04 | Discharge: 2023-07-04 | Payer: MEDICARE

## 2023-07-04 ENCOUNTER — Ambulatory Visit: Admit: 2023-07-04 | Discharge: 2023-07-04 | Payer: MEDICARE

## 2023-07-05 ENCOUNTER — Encounter: Admit: 2023-07-05 | Discharge: 2023-07-05 | Payer: MEDICARE

## 2023-07-08 ENCOUNTER — Encounter: Admit: 2023-07-08 | Discharge: 2023-07-08 | Payer: MEDICARE

## 2023-07-08 NOTE — Progress Notes
 HH referral successfully faxed to [facility] @ [phone]    The following job has been successfully delivered to the specified destinations or intermediate server.    ----- Original Job Details -----    Product Name: [name] Flow MFP [id number]    User: [employer]    Date: Feb/21/2025 10:52:27 AM (-0600 GMT)    Scanned Pages: 8    ----- Destination -----    [id number] Success    ----- Additional Details -----    Job     Date/Time                      Type  Line                           Identification  Duration  Pages  Result       [id number]  Feb/21/2025 10:41:57 AM        Send  Analog                         [id number]     10:14     8      Success

## 2023-07-16 ENCOUNTER — Encounter: Admit: 2023-07-16 | Discharge: 2023-07-16 | Payer: MEDICARE

## 2023-07-16 NOTE — Telephone Encounter
 Referral for ALS.  Per Neuro: Pt has continued to have issues with NIV and bipap, most recently is using cpap which he seems to tolerate well. Has not tolerated the other machines much at all.     RN will contact pt to obtain cpap info.

## 2023-08-05 ENCOUNTER — Ambulatory Visit: Admit: 2023-08-05 | Discharge: 2023-08-06 | Payer: MEDICARE

## 2023-08-05 ENCOUNTER — Encounter: Admit: 2023-08-05 | Discharge: 2023-08-05 | Payer: MEDICARE

## 2023-08-05 NOTE — Telephone Encounter
 Order for Trilogy/Astral placed as discussed per office note on 08/05/23    DME: [facility]  Will continue to follow up.

## 2023-08-09 ENCOUNTER — Encounter: Admit: 2023-08-09 | Discharge: 2023-08-09 | Payer: MEDICARE

## 2023-08-09 NOTE — Telephone Encounter
 DME- [facility]     1. Please provide suction catheter   2. Cough Assist   3. Increase cough assist settings  Will continue to follow up.

## 2023-09-05 ENCOUNTER — Encounter: Admit: 2023-09-05 | Discharge: 2023-09-05 | Payer: MEDICARE

## 2023-09-05 NOTE — Progress Notes
 Faxed signed PT order to [facility] @ [phone]. Fax successful

## 2023-09-06 ENCOUNTER — Encounter: Admit: 2023-09-06 | Discharge: 2023-09-06 | Payer: MEDICARE

## 2023-09-06 NOTE — Progress Notes
 07/21/23 Successfully faxed PT Outpatient Progress Notes dated 06/17/23 and 07/15/23 signed and dated by Dr. Varon to [facility]. Documentation sent to medical records.    From: [email or web address] @ [email or web address]>   Sent: Thursday, July 21, 2023 3:53 PM  To: Jeannene Milling @ .[email or web address]>  Subject: Job Notification: Success    The following job has been successfully delivered to the specified destinations or intermediate server.  ----- Original Job Details -----  Product Name: [employer] MFP [id number]  User: Jeannene Milling  Date: Mar/10/2023 3:52:46 PM (-0600 GMT)  Scanned Pages: 6  ----- Destination -----  [id number] Success  ----- Additional Details -----  Job     Date/Time                      Type  Line                           Identification  Duration  Pages  Result     [id number]  Mar/10/2023 3:49:30 PM          Send  Analog                         [id number]    03:08     6      Success

## 2023-09-18 ENCOUNTER — Encounter: Admit: 2023-09-18 | Discharge: 2023-09-18 | Payer: MEDICARE

## 2023-09-19 ENCOUNTER — Ambulatory Visit: Admit: 2023-09-19 | Discharge: 2023-09-20 | Payer: MEDICARE

## 2023-09-19 ENCOUNTER — Encounter: Admit: 2023-09-19 | Discharge: 2023-09-19 | Payer: MEDICARE

## 2023-09-23 ENCOUNTER — Encounter: Admit: 2023-09-23 | Discharge: 2023-09-23 | Payer: MEDICARE

## 2023-09-23 NOTE — Progress Notes
 Aquatic PT referral successfully faxed to [facility] @ [phone]    The following job has been successfully delivered to the specified destinations or intermediate server.    ----- Original Job Details -----    Product Name: [employer] LaserJet Flow MFP [id number]    User: Norva Beecham    Date: May/01/2024 12:11:28 PM (-0500 GMT)    Scanned Pages: 10    ----- Destination -----    [id number] Success    ----- Additional Details -----    Job     Date/Time                      Type  Line                           Identification  Duration  Pages  Result       [id number]  May/01/2024 11:59:37 AM         Send  Analog                         [id number]     11:39     10     Success

## 2023-11-09 ENCOUNTER — Encounter: Admit: 2023-11-09 | Discharge: 2023-11-09 | Payer: MEDICARE

## 2023-11-24 ENCOUNTER — Encounter: Admit: 2023-11-24 | Discharge: 2023-11-24 | Payer: MEDICARE

## 2023-12-06 ENCOUNTER — Encounter: Admit: 2023-12-06 | Discharge: 2023-12-06 | Payer: MEDICARE

## 2023-12-09 ENCOUNTER — Encounter: Admit: 2023-12-09 | Discharge: 2023-12-09 | Payer: MEDICARE

## 2023-12-09 ENCOUNTER — Ambulatory Visit: Admit: 2023-12-09 | Discharge: 2023-12-09 | Payer: MEDICARE

## 2023-12-09 ENCOUNTER — Ambulatory Visit: Admit: 2023-12-09 | Discharge: 2023-12-10 | Payer: MEDICARE

## 2023-12-12 ENCOUNTER — Encounter: Admit: 2023-12-12 | Discharge: 2023-12-12 | Payer: MEDICARE

## 2023-12-13 ENCOUNTER — Encounter: Admit: 2023-12-13 | Discharge: 2023-12-13 | Payer: MEDICARE

## 2023-12-15 ENCOUNTER — Encounter: Admit: 2023-12-15 | Discharge: 2023-12-15 | Payer: MEDICARE

## 2023-12-15 NOTE — Telephone Encounter
 Order for NEBULIZER  MOUTHPIECE VENT/SIP/BACKPACK placed as discussed per office note on 12/12/23   DME: [facility]  Will continue to follow up.

## 2023-12-26 ENCOUNTER — Ambulatory Visit: Admit: 2023-12-26 | Discharge: 2023-12-27 | Payer: MEDICARE

## 2023-12-26 ENCOUNTER — Encounter: Admit: 2023-12-26 | Discharge: 2023-12-26 | Payer: MEDICARE

## 2024-01-02 ENCOUNTER — Encounter: Admit: 2024-01-02 | Discharge: 2024-01-02 | Payer: MEDICARE

## 2024-01-02 NOTE — Progress Notes
 Faxed signed orders for ventilator to [facility] @ [phone]. Fax successful

## 2024-01-10 ENCOUNTER — Encounter: Admit: 2024-01-10 | Discharge: 2024-01-10 | Payer: MEDICARE

## 2024-01-10 MED ORDER — PANTOPRAZOLE 40 MG PO TBEC
40 mg | ORAL_TABLET | Freq: Every day | ORAL | 0 refills | 90.00000 days | Status: AC
Start: 2024-01-10 — End: ?

## 2024-01-11 ENCOUNTER — Encounter: Admit: 2024-01-11 | Discharge: 2024-01-11 | Payer: MEDICARE

## 2024-01-13 ENCOUNTER — Encounter: Admit: 2024-01-13 | Discharge: 2024-01-13 | Payer: MEDICARE

## 2024-01-13 DIAGNOSIS — G1221 Amyotrophic lateral sclerosis: Principal | ICD-10-CM

## 2024-01-13 NOTE — Progress Notes
 RN received voice message from [name], patient's wife. She state that they were ready to proceed with hospice. RN returned phone call. Two patient identifiers were used for safety, She stated that they would like to admit to [facility]. The contact is Dwayne Walters and his number is [phone]. RN will send order to [facility].

## 2024-01-18 ENCOUNTER — Encounter: Admit: 2024-01-18 | Discharge: 2024-01-18 | Payer: MEDICARE

## 2024-01-18 NOTE — Progress Notes
 RN received voice message from [place], patient's daughter. She stated that she needed some FMLA paperwork filled out. RN returned phone call to [phone]. RN left a voice message with fax, email and phone number.

## 2024-01-20 ENCOUNTER — Encounter: Admit: 2024-01-20 | Discharge: 2024-01-20 | Payer: MEDICARE

## 2024-01-23 ENCOUNTER — Encounter: Admit: 2024-01-23 | Discharge: 2024-01-23 | Payer: MEDICARE

## 2024-01-23 NOTE — Progress Notes
 FMLA paperwork emailed to patient's daughter [email or web address] Copy sent to medical records

## 2024-01-30 ENCOUNTER — Encounter: Admit: 2024-01-30 | Discharge: 2024-01-30 | Payer: MEDICARE

## 2024-02-05 ENCOUNTER — Encounter: Admit: 2024-02-05 | Discharge: 2024-02-05 | Payer: MEDICARE

## 2024-02-08 ENCOUNTER — Encounter: Admit: 2024-02-08 | Discharge: 2024-02-08 | Payer: MEDICARE

## 2024-02-08 NOTE — Telephone Encounter
 RN received cover my meds key for Protonix . RN tried to fill out the key. It stated that form could not be found. RN called the [facility] to request a new key. The tech stated that the script was ready for pick up at no charge so not sure why the key was started. RN expressed thanks.

## 2024-03-04 ENCOUNTER — Encounter: Admit: 2024-03-04 | Discharge: 2024-03-04 | Payer: MEDICARE

## 2024-03-26 ENCOUNTER — Encounter: Admit: 2024-03-26 | Discharge: 2024-03-26 | Payer: MEDICARE

## 2024-03-26 ENCOUNTER — Ambulatory Visit: Admit: 2024-03-26 | Discharge: 2024-03-27 | Payer: MEDICARE

## 2024-03-27 ENCOUNTER — Encounter: Admit: 2024-03-27 | Discharge: 2024-03-27 | Payer: MEDICARE

## 2024-03-27 NOTE — Telephone Encounter [36]
 RN reached out to [employer] with [facility] [place] [facility]. RN asked if they could provide nebulized saline for his mucous. [place] stated they could. RN efaxed office notes to fax number [phone].

## 2024-03-28 ENCOUNTER — Encounter: Admit: 2024-03-28 | Discharge: 2024-03-28 | Payer: MEDICARE

## 2024-03-28 NOTE — Telephone Encounter [36]
 RN received phone call from [employer] with [facility]. [facility]. [phone]. She asked about the nebulized saline, exactly how much and how frequent. RN sent staff message to Dr. Varon for clarification. RN will call her back once she hears from Dr. Varon.

## 2024-03-29 ENCOUNTER — Encounter: Admit: 2024-03-29 | Discharge: 2024-03-29 | Payer: MEDICARE

## 2024-03-29 NOTE — Telephone Encounter [36]
-----   Message from CHRISTELLA Nail, MD sent at 03/28/2024  9:45 PM CST -----  Regarding: RE: Nebulized Saline  That's great!    Order is usually 3% sodium chloride nebulized solution, give 4mL via neb up to twice daily as needed.     Thanks, mv  ----- Message -----  From: Heidi Area, RN  Sent: 03/28/2024  11:11 AM CST  To: Donnice JAYSON Nail, MD; Graeme Gobble; Area #  Subject: Nebulized Saline                                 Dr. Varon,  I reached out to the hospice and they are happy to do it. They wanted to know exactly what you were wanting and how frequent.    Thanks.  Area

## 2024-03-29 NOTE — Telephone Encounter [36]
 RN called [facility] and gave the information for the nebulized saline. They will get the order placed for patient.

## 2024-06-17 DEATH — deceased

## 2024-06-18 ENCOUNTER — Encounter: Admit: 2024-06-18 | Discharge: 2024-06-18 | Payer: MEDICARE

## 2024-06-18 NOTE — Progress Notes [1]
 POST-MORTEM DOCUMENTATION    Name: Dwayne Walters   MRN: [id number]     DOB: Sep 15, 1945      Age: 79 y.o.  Admission Date: (Not on file)     LOS: 0 days     Date of Service: Jun 27, 2024      Date of death if known:  Tuesday, 06-21-2024  Location of death, if known: Unknown  How were you notified?  Phone  Who notified us  of death? Delon Mains- daughter    Was hospice involved? Yes  Name of hospice involved, if known: [facility]  Date of hospice admission, if known:     Other information:
